# Patient Record
Sex: Female | Born: 1955
Health system: Southern US, Community
[De-identification: ages and names within clinical notes are randomized; demographics above are authoritative.]

## PROBLEM LIST (undated history)

## (undated) DIAGNOSIS — R011 Cardiac murmur, unspecified: Secondary | ICD-10-CM

## (undated) DIAGNOSIS — K2980 Duodenitis without bleeding: Secondary | ICD-10-CM

## (undated) DIAGNOSIS — K635 Polyp of colon: Secondary | ICD-10-CM

## (undated) DIAGNOSIS — J45909 Unspecified asthma, uncomplicated: Secondary | ICD-10-CM

## (undated) DIAGNOSIS — M5020 Other cervical disc displacement, unspecified cervical region: Secondary | ICD-10-CM

## (undated) DIAGNOSIS — Z87442 Personal history of urinary calculi: Secondary | ICD-10-CM

## (undated) DIAGNOSIS — K219 Gastro-esophageal reflux disease without esophagitis: Secondary | ICD-10-CM

## (undated) DIAGNOSIS — R7303 Prediabetes: Secondary | ICD-10-CM

## (undated) DIAGNOSIS — F419 Anxiety disorder, unspecified: Secondary | ICD-10-CM

## (undated) DIAGNOSIS — L309 Dermatitis, unspecified: Secondary | ICD-10-CM

## (undated) DIAGNOSIS — T7840XA Allergy, unspecified, initial encounter: Secondary | ICD-10-CM

## (undated) DIAGNOSIS — E7800 Pure hypercholesterolemia, unspecified: Secondary | ICD-10-CM

## (undated) HISTORY — PX: OTHER SURGICAL HISTORY: SHX169

## (undated) HISTORY — PX: CARDIAC CATHETERIZATION: SHX172

## (undated) HISTORY — DX: Duodenitis without bleeding: K29.80

## (undated) HISTORY — DX: Other cervical disc displacement, unspecified cervical region: M50.20

## (undated) HISTORY — DX: Gastro-esophageal reflux disease without esophagitis: K21.9

## (undated) HISTORY — DX: Cardiac murmur, unspecified: R01.1

## (undated) HISTORY — DX: Polyp of colon: K63.5

## (undated) HISTORY — DX: Allergy, unspecified, initial encounter: T78.40XA

## (undated) HISTORY — DX: Anxiety disorder, unspecified: F41.9

## (undated) HISTORY — DX: Dermatitis, unspecified: L30.9

## (undated) HISTORY — DX: Unspecified asthma, uncomplicated: J45.909

## (undated) HISTORY — DX: Pure hypercholesterolemia, unspecified: E78.00

---

## 1997-12-09 ENCOUNTER — Ambulatory Visit (HOSPITAL_COMMUNITY): Admission: RE | Admit: 1997-12-09 | Discharge: 1997-12-09 | Payer: Self-pay | Admitting: Internal Medicine

## 1998-10-21 ENCOUNTER — Other Ambulatory Visit: Admission: RE | Admit: 1998-10-21 | Discharge: 1998-10-21 | Payer: Self-pay | Admitting: Obstetrics & Gynecology

## 1999-04-17 ENCOUNTER — Emergency Department (HOSPITAL_COMMUNITY): Admission: EM | Admit: 1999-04-17 | Discharge: 1999-04-18 | Payer: Self-pay | Admitting: Emergency Medicine

## 1999-04-20 ENCOUNTER — Ambulatory Visit (HOSPITAL_COMMUNITY): Admission: RE | Admit: 1999-04-20 | Discharge: 1999-04-20 | Payer: Self-pay | Admitting: Internal Medicine

## 1999-04-21 ENCOUNTER — Ambulatory Visit (HOSPITAL_COMMUNITY): Admission: RE | Admit: 1999-04-21 | Discharge: 1999-04-21 | Payer: Self-pay | Admitting: Internal Medicine

## 1999-04-21 ENCOUNTER — Encounter: Payer: Self-pay | Admitting: Internal Medicine

## 1999-05-26 ENCOUNTER — Encounter: Payer: Self-pay | Admitting: Obstetrics & Gynecology

## 1999-05-26 ENCOUNTER — Ambulatory Visit (HOSPITAL_COMMUNITY): Admission: RE | Admit: 1999-05-26 | Discharge: 1999-05-26 | Payer: Self-pay | Admitting: Obstetrics & Gynecology

## 1999-08-17 ENCOUNTER — Encounter: Payer: Self-pay | Admitting: Emergency Medicine

## 1999-08-17 ENCOUNTER — Emergency Department (HOSPITAL_COMMUNITY): Admission: EM | Admit: 1999-08-17 | Discharge: 1999-08-17 | Payer: Self-pay | Admitting: Emergency Medicine

## 1999-08-20 ENCOUNTER — Ambulatory Visit (HOSPITAL_COMMUNITY): Admission: RE | Admit: 1999-08-20 | Discharge: 1999-08-20 | Payer: Self-pay | Admitting: Internal Medicine

## 1999-08-25 ENCOUNTER — Emergency Department (HOSPITAL_COMMUNITY): Admission: EM | Admit: 1999-08-25 | Discharge: 1999-08-25 | Payer: Self-pay | Admitting: Emergency Medicine

## 1999-09-02 ENCOUNTER — Ambulatory Visit (HOSPITAL_COMMUNITY): Admission: RE | Admit: 1999-09-02 | Discharge: 1999-09-02 | Payer: Self-pay | Admitting: Internal Medicine

## 1999-09-24 ENCOUNTER — Encounter: Admission: RE | Admit: 1999-09-24 | Discharge: 1999-09-24 | Payer: Self-pay | Admitting: Family Medicine

## 1999-12-14 ENCOUNTER — Other Ambulatory Visit: Admission: RE | Admit: 1999-12-14 | Discharge: 1999-12-14 | Payer: Self-pay | Admitting: Obstetrics & Gynecology

## 2000-05-29 ENCOUNTER — Encounter: Payer: Self-pay | Admitting: Obstetrics & Gynecology

## 2000-05-29 ENCOUNTER — Ambulatory Visit (HOSPITAL_COMMUNITY): Admission: RE | Admit: 2000-05-29 | Discharge: 2000-05-29 | Payer: Self-pay | Admitting: Obstetrics & Gynecology

## 2001-01-02 ENCOUNTER — Other Ambulatory Visit: Admission: RE | Admit: 2001-01-02 | Discharge: 2001-01-02 | Payer: Self-pay | Admitting: Obstetrics & Gynecology

## 2001-07-31 ENCOUNTER — Encounter: Payer: Self-pay | Admitting: Obstetrics & Gynecology

## 2001-07-31 ENCOUNTER — Ambulatory Visit (HOSPITAL_COMMUNITY): Admission: RE | Admit: 2001-07-31 | Discharge: 2001-07-31 | Payer: Self-pay | Admitting: Obstetrics & Gynecology

## 2001-11-27 ENCOUNTER — Other Ambulatory Visit: Admission: RE | Admit: 2001-11-27 | Discharge: 2001-11-27 | Payer: Self-pay | Admitting: Obstetrics & Gynecology

## 2002-06-28 ENCOUNTER — Emergency Department (HOSPITAL_COMMUNITY): Admission: EM | Admit: 2002-06-28 | Discharge: 2002-06-29 | Payer: Self-pay | Admitting: Emergency Medicine

## 2002-06-29 ENCOUNTER — Encounter: Payer: Self-pay | Admitting: *Deleted

## 2002-07-27 ENCOUNTER — Emergency Department (HOSPITAL_COMMUNITY): Admission: EM | Admit: 2002-07-27 | Discharge: 2002-07-28 | Payer: Self-pay | Admitting: Emergency Medicine

## 2002-08-01 ENCOUNTER — Ambulatory Visit (HOSPITAL_COMMUNITY): Admission: RE | Admit: 2002-08-01 | Discharge: 2002-08-01 | Payer: Self-pay | Admitting: Obstetrics & Gynecology

## 2002-08-01 ENCOUNTER — Encounter: Payer: Self-pay | Admitting: Obstetrics & Gynecology

## 2002-12-19 ENCOUNTER — Other Ambulatory Visit: Admission: RE | Admit: 2002-12-19 | Discharge: 2002-12-19 | Payer: Self-pay | Admitting: Obstetrics & Gynecology

## 2003-10-03 ENCOUNTER — Ambulatory Visit (HOSPITAL_COMMUNITY): Admission: RE | Admit: 2003-10-03 | Discharge: 2003-10-03 | Payer: Self-pay | Admitting: Obstetrics & Gynecology

## 2004-01-19 ENCOUNTER — Other Ambulatory Visit: Admission: RE | Admit: 2004-01-19 | Discharge: 2004-01-19 | Payer: Self-pay | Admitting: Obstetrics & Gynecology

## 2004-11-09 ENCOUNTER — Ambulatory Visit (HOSPITAL_COMMUNITY): Admission: RE | Admit: 2004-11-09 | Discharge: 2004-11-09 | Payer: Self-pay | Admitting: Obstetrics & Gynecology

## 2005-07-08 ENCOUNTER — Other Ambulatory Visit: Admission: RE | Admit: 2005-07-08 | Discharge: 2005-07-08 | Payer: Self-pay | Admitting: Obstetrics & Gynecology

## 2005-08-02 ENCOUNTER — Ambulatory Visit: Payer: Self-pay | Admitting: Family Medicine

## 2005-08-09 ENCOUNTER — Ambulatory Visit: Payer: Self-pay | Admitting: Family Medicine

## 2008-11-06 ENCOUNTER — Ambulatory Visit (HOSPITAL_COMMUNITY): Admission: RE | Admit: 2008-11-06 | Discharge: 2008-11-06 | Payer: Self-pay | Admitting: Obstetrics and Gynecology

## 2008-12-18 ENCOUNTER — Ambulatory Visit: Payer: Self-pay | Admitting: Licensed Clinical Social Worker

## 2009-11-20 ENCOUNTER — Ambulatory Visit (HOSPITAL_COMMUNITY): Admission: RE | Admit: 2009-11-20 | Discharge: 2009-11-20 | Payer: Self-pay | Admitting: Obstetrics and Gynecology

## 2009-11-26 ENCOUNTER — Encounter: Admission: RE | Admit: 2009-11-26 | Discharge: 2009-11-26 | Payer: Self-pay | Admitting: Obstetrics and Gynecology

## 2010-04-23 ENCOUNTER — Observation Stay (HOSPITAL_COMMUNITY): Admission: EM | Admit: 2010-04-23 | Discharge: 2010-04-23 | Payer: Self-pay | Admitting: Emergency Medicine

## 2010-04-27 ENCOUNTER — Telehealth (INDEPENDENT_AMBULATORY_CARE_PROVIDER_SITE_OTHER): Payer: Self-pay | Admitting: *Deleted

## 2010-04-27 ENCOUNTER — Encounter: Payer: Self-pay | Admitting: Cardiology

## 2010-04-28 ENCOUNTER — Ambulatory Visit: Payer: Self-pay | Admitting: Cardiology

## 2010-04-28 ENCOUNTER — Encounter: Payer: Self-pay | Admitting: Cardiology

## 2010-04-28 ENCOUNTER — Ambulatory Visit: Payer: Self-pay

## 2010-04-28 ENCOUNTER — Encounter (HOSPITAL_COMMUNITY): Admission: RE | Admit: 2010-04-28 | Discharge: 2010-06-10 | Payer: Self-pay | Admitting: Internal Medicine

## 2010-10-10 ENCOUNTER — Encounter: Payer: Self-pay | Admitting: Obstetrics and Gynecology

## 2010-10-19 NOTE — Letter (Signed)
Summary: [facility]   [facility]   Imported By: Marylou Mccoy 06/07/2010 14:20:30  _____________________________________________________________________  External Attachment:    Type:   Image     Comment:   External Document

## 2010-10-19 NOTE — Progress Notes (Signed)
Summary: Nuclear Pre-Procedure  Phone Note Outgoing Call Call back at [facility] Phone [phone]   Call placed by: Stanton Kidney, EMT-P,  April 27, 2010 2:37 PM Call placed to: Patient Action Taken: Phone Call Completed Summary of Call: Reviewed information on Myoview Information Sheet (see scanned document for further details).  Spoke with the patient.    Nuclear Med Background Indications for Stress Test: Evaluation for Ischemia, Post Hospital  Indications Comments: 04/23/10 [facility]: [place], MI R/O    Symptoms: Chest Pain    Nuclear Pre-Procedure Cardiac Risk Factors: Family History - CAD, Lipids

## 2010-10-19 NOTE — Assessment & Plan Note (Signed)
Summary: Cardiology Nuclear Testing  Nuclear Med Background Indications for Stress Test: Evaluation for Ischemia, Post Hospital  Indications Comments: 04/23/10 [facility]: [place], MI R/O  History: Asthma, COPD, GXT  History Comments: GXT 10 yrs ago: Ok per patient.  Symptoms: Chest Pain    Nuclear Pre-Procedure Cardiac Risk Factors: Family History - CAD, Lipids Caffeine/Decaff Intake: none NPO After: 6:00 PM Lungs: Clear IV 0.9% NS with Angio Cath: 22g     IV Site: Rt AC IV Started by: Bonnita Levan RN Chest Size (in) 38      Cup Size D     Height (in): 69 Weight (lb): 214 BMI: 31.72  Nuclear Med Study 1 or 2 day study:  1 day     Stress Test Type:  Stress Reading MD:  [name], MD     Referring MD:  Elmore Guise Resting Radionuclide:  Technetium 27m Tetrofosmin     Resting Radionuclide Dose:  11.0 mCi  Stress Radionuclide:  Technetium 47m Tetrofosmin     Stress Radionuclide Dose:  32.9 mCi   Stress Protocol Exercise Time (min):  9:00 min     Max HR:  171 bpm     Predicted Max HR:  166 bpm  Max Systolic BP: 182 mm Hg     Percent Max HR:  103.01 %     METS: 10.1 Rate Pressure Product:  [address]    Stress Test Technologist:  Irean Hong RN     Nuclear Technologist:  Harlow Asa CNMT  Rest Procedure  Myocardial perfusion imaging was performed at rest 45 minutes following the intravenous administration of Myoview Technetium 9m Tetrofosmin.  Stress Procedure  The patient exercised for  nine minutes.  The patient stopped due to DOE  and denied any chest pain.  The EKG was nondiagnostic due to baseline T-wave changes, rare PVC/Couplet.  Myoview was injected at peak exercise and myocardial perfusion imaging was performed after a brief delay.  QPS Raw Data Images:  Normal; no motion artifact; normal heart/lung ratio. Stress Images:  Small apical perfusion defect.  Rest Images:  Small apical perfusion defect.  Subtraction (SDS):  Small fixed apical perfusion defect.  Transient  Ischemic Dilatation:  0.89  (Normal <1.22)  Lung/Heart Ratio:  0.34  (Normal <0.45)  Quantitative Gated Spect Images QGS EDV:  97 ml QGS ESV:  29 ml QGS EF:  70 % QGS cine images:  Normal wall motion.    Overall Impression  Exercise Capacity: Good exercise capacity. BP Response: Normal blood pressure response. Clinical Symptoms: Stopped due to dyspnea, oxygen saturation dropped to 82% with exertion.  ECG Impression: Incomplete RBBB, no significant change with exertion.  Overall Impression: Small fixed apical perfusion defect appears to be most likely breast attenuation.  No evidence for ischemia or infarction.  Normal wall motion and EF.

## 2010-10-29 ENCOUNTER — Other Ambulatory Visit: Payer: Self-pay | Admitting: Internal Medicine

## 2010-10-29 DIAGNOSIS — Z1231 Encounter for screening mammogram for malignant neoplasm of breast: Secondary | ICD-10-CM

## 2010-11-25 ENCOUNTER — Ambulatory Visit
Admission: RE | Admit: 2010-11-25 | Discharge: 2010-11-25 | Disposition: A | Discharge status: Home / Self Care | Payer: BC Managed Care – PPO | Source: Ambulatory Visit | Attending: Internal Medicine | Admitting: Internal Medicine

## 2010-11-25 DIAGNOSIS — Z1231 Encounter for screening mammogram for malignant neoplasm of breast: Secondary | ICD-10-CM

## 2010-12-03 LAB — HEMOGLOBIN A1C
Hgb A1c MFr Bld: 6 % — ABNORMAL HIGH (ref <5.7)
Mean Plasma Glucose: 126 mg/dL — ABNORMAL HIGH (ref <117)

## 2010-12-03 LAB — CBC
HCT: 36.7 % (ref 36.0–46.0)
Hemoglobin: 12.1 g/dL (ref 12.0–15.0)
MCH: 29.8 pg (ref 26.0–34.0)
MCHC: 33 g/dL (ref 30.0–36.0)
MCV: 90.4 fL (ref 78.0–100.0)
Platelets: 220 10*3/uL (ref 150–400)
RBC: 4.06 MIL/uL (ref 3.87–5.11)
RDW: 12.3 % (ref 11.5–15.5)
WBC: 6.3 10*3/uL (ref 4.0–10.5)

## 2010-12-03 LAB — BASIC METABOLIC PANEL
BUN: 11 mg/dL (ref 6–23)
CO2: 24 mEq/L (ref 19–32)
Calcium: 8.9 mg/dL (ref 8.4–10.5)
Chloride: 104 mEq/L (ref 96–112)
Creatinine, Ser: 0.86 mg/dL (ref 0.4–1.2)
GFR calc Af Amer: >60 mL/min (ref >60)
GFR calc non Af Amer: >60 mL/min (ref >60)
Glucose, Bld: 113 mg/dL — ABNORMAL HIGH (ref 70–99)
Potassium: 3.7 mEq/L (ref 3.5–5.1)
Sodium: 137 mEq/L (ref 135–145)

## 2010-12-03 LAB — POCT CARDIAC MARKERS
CKMB, poc: 1.4 ng/mL (ref 1.0–8.0)
Myoglobin, poc: 83 ng/mL (ref 12–200)
Troponin i, poc: <0.05 ng/mL (ref 0.00–0.09)

## 2010-12-03 LAB — DIFFERENTIAL
Basophils Absolute: 0 10*3/uL (ref 0.0–0.1)
Basophils Relative: 1 % (ref 0–1)
Eosinophils Absolute: 0.4 10*3/uL (ref 0.0–0.7)
Eosinophils Relative: 6 % — ABNORMAL HIGH (ref 0–5)
Lymphocytes Relative: 38 % (ref 12–46)
Lymphs Abs: 2.4 10*3/uL (ref 0.7–4.0)
Monocytes Absolute: 0.7 10*3/uL (ref 0.1–1.0)
Monocytes Relative: 11 % (ref 3–12)
Neutro Abs: 2.8 10*3/uL (ref 1.7–7.7)
Neutrophils Relative %: 45 % (ref 43–77)

## 2010-12-03 LAB — TSH: TSH: 3.419 u[IU]/mL (ref 0.350–4.500)

## 2010-12-03 LAB — LIPID PANEL
Cholesterol: 150 mg/dL (ref 0–200)
HDL: 49 mg/dL (ref >39)
LDL Cholesterol: 81 mg/dL (ref 0–99)
Total CHOL/HDL Ratio: 3.1 RATIO
Triglycerides: 99 mg/dL (ref <150)
VLDL: 20 mg/dL (ref 0–40)

## 2010-12-03 LAB — CK TOTAL AND CKMB (NOT AT ARMC)
CK, MB: 2.5 ng/mL (ref 0.3–4.0)
Relative Index: 1.3 (ref 0.0–2.5)
Total CK: 190 U/L — ABNORMAL HIGH (ref 7–177)

## 2010-12-03 LAB — CARDIAC PANEL(CRET KIN+CKTOT+MB+TROPI)
CK, MB: 2 ng/mL (ref 0.3–4.0)
Relative Index: 1.2 (ref 0.0–2.5)
Total CK: 170 U/L (ref 7–177)
Troponin I: <0.01 ng/mL (ref 0.00–0.06)

## 2010-12-03 LAB — TROPONIN I: Troponin I: <0.01 ng/mL (ref 0.00–0.06)

## 2011-06-04 ENCOUNTER — Emergency Department (HOSPITAL_COMMUNITY): Payer: BC Managed Care – PPO

## 2011-06-04 ENCOUNTER — Emergency Department (HOSPITAL_COMMUNITY)
Admission: EM | Admit: 2011-06-04 | Discharge: 2011-06-04 | Disposition: A | Discharge status: Home / Self Care | Payer: BC Managed Care – PPO | Attending: Emergency Medicine | Admitting: Emergency Medicine

## 2011-06-04 DIAGNOSIS — R112 Nausea with vomiting, unspecified: Secondary | ICD-10-CM | POA: Insufficient documentation

## 2011-06-04 DIAGNOSIS — R109 Unspecified abdominal pain: Secondary | ICD-10-CM | POA: Insufficient documentation

## 2011-06-04 DIAGNOSIS — N200 Calculus of kidney: Secondary | ICD-10-CM | POA: Insufficient documentation

## 2011-06-04 DIAGNOSIS — N201 Calculus of ureter: Secondary | ICD-10-CM | POA: Insufficient documentation

## 2011-06-04 DIAGNOSIS — E7800 Pure hypercholesterolemia, unspecified: Secondary | ICD-10-CM | POA: Insufficient documentation

## 2011-06-04 LAB — URINALYSIS, ROUTINE W REFLEX MICROSCOPIC
Bilirubin Urine: NEGATIVE
Glucose, UA: NEGATIVE mg/dL
Ketones, ur: NEGATIVE mg/dL
Leukocytes, UA: NEGATIVE
Nitrite: NEGATIVE
Protein, ur: NEGATIVE mg/dL
Specific Gravity, Urine: 1.022 (ref 1.005–1.030)
Urobilinogen, UA: 0.2 mg/dL (ref 0.0–1.0)
pH: 5 (ref 5.0–8.0)

## 2011-06-04 LAB — URINE MICROSCOPIC-ADD ON

## 2011-11-02 ENCOUNTER — Other Ambulatory Visit (HOSPITAL_COMMUNITY): Payer: Self-pay | Admitting: Internal Medicine

## 2011-11-02 DIAGNOSIS — Z1231 Encounter for screening mammogram for malignant neoplasm of breast: Secondary | ICD-10-CM

## 2011-11-28 ENCOUNTER — Ambulatory Visit (HOSPITAL_COMMUNITY)
Admission: RE | Admit: 2011-11-28 | Discharge: 2011-11-28 | Disposition: A | Discharge status: Home / Self Care | Payer: BC Managed Care – PPO | Source: Ambulatory Visit | Attending: Internal Medicine | Admitting: Internal Medicine

## 2011-11-28 DIAGNOSIS — Z1231 Encounter for screening mammogram for malignant neoplasm of breast: Secondary | ICD-10-CM

## 2012-11-02 ENCOUNTER — Other Ambulatory Visit (HOSPITAL_COMMUNITY): Payer: Self-pay | Admitting: Internal Medicine

## 2012-11-02 DIAGNOSIS — Z1231 Encounter for screening mammogram for malignant neoplasm of breast: Secondary | ICD-10-CM

## 2012-11-28 ENCOUNTER — Ambulatory Visit (HOSPITAL_COMMUNITY)
Admission: RE | Admit: 2012-11-28 | Discharge: 2012-11-28 | Disposition: A | Discharge status: Home / Self Care | Payer: BC Managed Care – PPO | Source: Ambulatory Visit | Attending: Internal Medicine | Admitting: Internal Medicine

## 2012-11-28 DIAGNOSIS — Z1231 Encounter for screening mammogram for malignant neoplasm of breast: Secondary | ICD-10-CM

## 2013-11-06 ENCOUNTER — Other Ambulatory Visit (HOSPITAL_COMMUNITY): Payer: Self-pay | Admitting: Internal Medicine

## 2013-11-06 DIAGNOSIS — Z1231 Encounter for screening mammogram for malignant neoplasm of breast: Secondary | ICD-10-CM

## 2013-12-02 ENCOUNTER — Ambulatory Visit (HOSPITAL_COMMUNITY)
Admission: RE | Admit: 2013-12-02 | Discharge: 2013-12-02 | Disposition: A | Discharge status: Home / Self Care | Payer: Medicare HMO | Source: Ambulatory Visit | Attending: Internal Medicine | Admitting: Internal Medicine

## 2013-12-02 DIAGNOSIS — Z1231 Encounter for screening mammogram for malignant neoplasm of breast: Secondary | ICD-10-CM

## 2013-12-22 ENCOUNTER — Ambulatory Visit (INDEPENDENT_AMBULATORY_CARE_PROVIDER_SITE_OTHER): Payer: Medicare HMO | Admitting: Internal Medicine

## 2013-12-22 VITALS — BP 108/70 | HR 67 | Temp 97.9°F | Resp 18 | Ht 70.0 in | Wt 221.0 lb

## 2013-12-22 DIAGNOSIS — R591 Generalized enlarged lymph nodes: Secondary | ICD-10-CM

## 2013-12-22 DIAGNOSIS — R599 Enlarged lymph nodes, unspecified: Secondary | ICD-10-CM

## 2013-12-22 DIAGNOSIS — E785 Hyperlipidemia, unspecified: Secondary | ICD-10-CM | POA: Insufficient documentation

## 2013-12-22 DIAGNOSIS — J029 Acute pharyngitis, unspecified: Secondary | ICD-10-CM

## 2013-12-22 DIAGNOSIS — Z6831 Body mass index (BMI) 31.0-31.9, adult: Secondary | ICD-10-CM | POA: Insufficient documentation

## 2013-12-22 LAB — POCT RAPID STREP A (OFFICE): Rapid Strep A Screen: NEGATIVE

## 2013-12-22 MED ORDER — AMOXICILLIN 500 MG PO CAPS: 1000.0000 mg | ORAL_CAPSULE | Freq: Two times a day (BID) | ORAL | Status: AC

## 2013-12-22 NOTE — Progress Notes (Addendum)
   Subjective:    Patient ID: Abigail Byrd, female    DOB: 1955-10-23, 58 y.o.   MRN: [id number]  Sore Throat  This is a new problem. The current episode started yesterday. The problem has been unchanged. The pain is worse on the left side. Associated symptoms include trouble swallowing. Pertinent negatives include no coughing. She has had no exposure to strep.   Chief Complaint  Patient presents with  . Sore Throat    swelling on left side since last night   This chart was scribed for Ellamae Siaobert Nykerria Macconnell, MD by Andrew Auaven Small, ED Scribe. This patient was seen in room 9 and the patient's care was started at 3:32 PM.  HPI Comments: Abigail MarvelSusan S Kozel is a 58 y.o. female who presents to the [facility] complaining of left sided sore throat onset 1 day with associated trouble swallowing. Pt describes that pain as sharp. Pt denies fever, cough, chills, diaphoresis and rhinorrhea. Pt denies pain when biting down. Pt denies exposure to strep.    Past Medical History  Diagnosis Date  . Allergy   . Anxiety   . Asthma    No Known Allergies Prior to Admission medications   Medication Sig Start Date End Date Taking? Authorizing Provider  atorvastatin (LIPITOR) 10 MG tablet Take 10 mg by mouth daily.   Yes Historical Provider, MD   Review of Systems  Constitutional: Negative for fever, chills and diaphoresis.  HENT: Positive for sore throat and trouble swallowing. Negative for dental problem and rhinorrhea.   Respiratory: Negative for cough.       Objective:   Physical Exam  Nursing note and vitals reviewed. Constitutional: She is oriented to person, place, and time. She appears well-developed and well-nourished. No distress.  HENT:  Head: Normocephalic and atraumatic.  Right Ear: External ear normal.  Left Ear: External ear normal.  Mouth/Throat: Posterior oropharyngeal erythema present. No oropharyngeal exudate.  Parotid non tender, parotid duct clear, no swelling under  tongue, no dental abcess  Eyes: EOM are normal.  Neck: Neck supple.  2+ cervical lymph nodes bilaterally  Cardiovascular: Normal rate.   Pulmonary/Chest: Effort normal.  Musculoskeletal: Normal range of motion.  Neurological: She is alert and oriented to person, place, and time.  Skin: Skin is warm and dry.  Psychiatric: She has a normal mood and affect. Her behavior is normal.        Assessment & Plan:  I have completed the patient encounter in its entirety as documented by the scribe, with editing by me where necessary. Pasha Broad P. Merla Richesoolittle, M.D.  Dysphagia--throat pain Lymphadenopathy left anterior cervical  Meds ordered this encounter  Medications  . amoxicillin (AMOXIL) 500 MG capsule    Sig: Take 2 capsules (1,000 mg total) by mouth 2 (two) times daily.    Dispense:  40 capsule    Refill:  0   Needs close f/u w/ ref to ent for imaging if not impr

## 2013-12-24 LAB — CULTURE, GROUP A STREP: Organism ID, Bacteria: NORMAL

## 2014-12-10 ENCOUNTER — Other Ambulatory Visit (HOSPITAL_COMMUNITY): Payer: Self-pay | Admitting: Internal Medicine

## 2014-12-10 DIAGNOSIS — Z1231 Encounter for screening mammogram for malignant neoplasm of breast: Secondary | ICD-10-CM

## 2014-12-17 ENCOUNTER — Ambulatory Visit (HOSPITAL_COMMUNITY)
Admission: RE | Admit: 2014-12-17 | Discharge: 2014-12-17 | Disposition: A | Discharge status: Home / Self Care | Payer: Managed Care, Other (non HMO) | Source: Ambulatory Visit | Attending: Internal Medicine | Admitting: Internal Medicine

## 2014-12-17 DIAGNOSIS — Z1231 Encounter for screening mammogram for malignant neoplasm of breast: Secondary | ICD-10-CM | POA: Insufficient documentation

## 2015-01-14 ENCOUNTER — Other Ambulatory Visit: Payer: Self-pay | Admitting: Obstetrics & Gynecology

## 2015-01-14 DIAGNOSIS — M8580 Other specified disorders of bone density and structure, unspecified site: Secondary | ICD-10-CM

## 2015-03-18 ENCOUNTER — Ambulatory Visit
Admission: RE | Admit: 2015-03-18 | Discharge: 2015-03-18 | Disposition: A | Discharge status: Home / Self Care | Payer: Managed Care, Other (non HMO) | Source: Ambulatory Visit | Attending: Obstetrics & Gynecology | Admitting: Obstetrics & Gynecology

## 2015-03-18 DIAGNOSIS — M8580 Other specified disorders of bone density and structure, unspecified site: Secondary | ICD-10-CM

## 2015-05-05 ENCOUNTER — Ambulatory Visit (INDEPENDENT_AMBULATORY_CARE_PROVIDER_SITE_OTHER): Payer: Managed Care, Other (non HMO) | Admitting: Neurology

## 2015-05-05 ENCOUNTER — Encounter: Payer: Self-pay | Admitting: Neurology

## 2015-05-05 VITALS — BP 122/81 | HR 63 | Ht 69.0 in | Wt 223.6 lb

## 2015-05-05 DIAGNOSIS — R251 Tremor, unspecified: Secondary | ICD-10-CM | POA: Diagnosis not present

## 2015-05-05 NOTE — Progress Notes (Signed)
Reason for visit: Tremor  Referring physician: Dr. Salvatore Marvel Abigail Byrd is a 59 y.o. female  History of present illness:  Abigail Byrd is a 59 year old right-handed white female with a history of a tremor that has been present for about one year. The tremor is quite mild, not always noticeable. At times, she may have a head nodding tremor that is quite subtle usually while she is reading with her neck partially flexed. She has no other symptoms, no change in speech, swallowing, gait, or any evidence of weakness or numbness of the extremities. She denies any problems with balance or difficulty controlling the bowels or the bladder. She denies any family history of tremor, but her father died when he was 63, and she does not know many of the uncles and aunts on the father's side of the family. The patient herself does not notice the tremor, but her daughters have pointed this out to her. She comes in with a video clip of the tremor. She has no tremor affecting the arms or legs. She comes in for an evaluation.  Past Medical History  Diagnosis Date  . Allergy   . Anxiety   . Asthma   . Duodenitis   . HNP (herniated nucleus pulposus), cervical   . Hypercholesteremia   . Eczema   . Colon polyp   . Kidney stones     Past Surgical History  Procedure Laterality Date  . Cesarean section      Family History  Problem Relation Age of Onset  . Cancer Mother   . Heart disease Mother   . Cancer Father     Social history:  reports that she has never smoked. She has never used smokeless tobacco. She reports that she drinks about 3.0 - 3.6 oz of alcohol per week. She reports that she does not use illicit drugs.  Medications:  Prior to Admission medications   Medication Sig Start Date End Date Taking? Authorizing Provider  atorvastatin (LIPITOR) 10 MG tablet Take 10 mg by mouth daily.   Yes Historical Provider, MD      Allergies  Allergen Reactions  . Celebrex [Celecoxib]      ROS:  Out of a complete 14 system review of symptoms, the patient complains only of the following symptoms, and all other reviewed systems are negative.  Allergies Tremor  Blood pressure 122/81, pulse 63, height  (1.753 m), weight 223 lb 9.6 oz (101.424 kg).  Physical Exam  General: The patient is alert and cooperative at the time of the examination.  Eyes: Pupils are equal, round, and reactive to light. Discs are flat bilaterally.  Neck: The neck is supple, no carotid bruits are noted.  Respiratory: The respiratory examination is clear.  Cardiovascular: The cardiovascular examination reveals a regular rate and rhythm, no obvious murmurs or rubs are noted.  Skin: Extremities are without significant edema.  Neurologic Exam  Mental status: The patient is alert and oriented x 3 at the time of the examination. The patient has apparent normal recent and remote memory, with an apparently normal attention span and concentration ability.  Cranial nerves: Facial symmetry is present. There is good sensation of the face to pinprick and soft touch bilaterally. The strength of the facial muscles and the muscles to head turning and shoulder shrug are normal bilaterally. Speech is well enunciated, no aphasia or dysarthria is noted. Extraocular movements are full. Visual fields are full. The tongue is midline, and the patient has symmetric  elevation of the soft palate. No obvious hearing deficits are noted.  Motor: The motor testing reveals 5 over 5 strength of all 4 extremities. Good symmetric motor tone is noted throughout.  Sensory: Sensory testing is intact to pinprick, soft touch, vibration sensation, and position sense on all 4 extremities. No evidence of extinction is noted.  Coordination: Cerebellar testing reveals good finger-nose-finger and heel-to-shin bilaterally.  Gait and station: Gait is normal. Tandem gait is normal. Romberg is negative. No drift is seen.  Reflexes:  Deep tendon reflexes are symmetric and normal bilaterally. Toes are downgoing bilaterally.   Assessment/Plan:  1. Head tremor, likely essential tremor  The patient currently does not have a tremor noticeable on clinical examination today. The video clip that she brought with her does show evidence of a very subtle head nodding tremor that likely represents an essential tremor. The patient has no definite family history of tremor, but her father died at young age, and may not have manifested the tremor. The head tremors with the essential type tremors may be a head nod or a side-to-side tremor. There are no other features of parkinsonism. At this point, I would not treat the tremor, the patient will contact me if the tremor is significantly worsening, or other new symptoms are noted. The patient otherwise will follow-up on an as-needed basis.   Marlan Palau MD 05/05/2015 7:59 PM  [facility] [address] [place], [place] [address]  Phone [phone] Fax [phone]

## 2015-05-05 NOTE — Patient Instructions (Signed)

## 2015-11-13 ENCOUNTER — Other Ambulatory Visit: Payer: Self-pay

## 2015-11-13 DIAGNOSIS — Z1231 Encounter for screening mammogram for malignant neoplasm of breast: Secondary | ICD-10-CM

## 2015-12-18 ENCOUNTER — Ambulatory Visit
Admission: RE | Admit: 2015-12-18 | Discharge: 2015-12-18 | Disposition: A | Discharge status: Home / Self Care | Payer: Managed Care, Other (non HMO) | Source: Ambulatory Visit

## 2015-12-18 DIAGNOSIS — Z1231 Encounter for screening mammogram for malignant neoplasm of breast: Secondary | ICD-10-CM

## 2016-08-02 ENCOUNTER — Ambulatory Visit
Admission: RE | Admit: 2016-08-02 | Discharge: 2016-08-02 | Disposition: A | Discharge status: Home / Self Care | Payer: Managed Care, Other (non HMO) | Source: Ambulatory Visit | Attending: Internal Medicine | Admitting: Internal Medicine

## 2016-08-02 ENCOUNTER — Other Ambulatory Visit: Payer: Self-pay | Admitting: Internal Medicine

## 2016-08-02 DIAGNOSIS — R52 Pain, unspecified: Secondary | ICD-10-CM

## 2016-10-13 DIAGNOSIS — R1110 Vomiting, unspecified: Secondary | ICD-10-CM | POA: Diagnosis not present

## 2016-10-14 ENCOUNTER — Emergency Department (HOSPITAL_COMMUNITY)
Admission: EM | Admit: 2016-10-14 | Discharge: 2016-10-14 | Disposition: A | Discharge status: Home / Self Care | Payer: Managed Care, Other (non HMO) | Attending: Emergency Medicine | Admitting: Emergency Medicine

## 2016-10-14 ENCOUNTER — Encounter (HOSPITAL_COMMUNITY): Payer: Self-pay | Admitting: Emergency Medicine

## 2016-10-14 DIAGNOSIS — R1312 Dysphagia, oropharyngeal phase: Secondary | ICD-10-CM | POA: Diagnosis not present

## 2016-10-14 DIAGNOSIS — J45909 Unspecified asthma, uncomplicated: Secondary | ICD-10-CM | POA: Insufficient documentation

## 2016-10-14 LAB — CBC
HCT: 41.2 % (ref 36.0–46.0)
Hemoglobin: 14 g/dL (ref 12.0–15.0)
MCH: 30.9 pg (ref 26.0–34.0)
MCHC: 34 g/dL (ref 30.0–36.0)
MCV: 90.9 fL (ref 78.0–100.0)
Platelets: 288 10*3/uL (ref 150–400)
RBC: 4.53 MIL/uL (ref 3.87–5.11)
RDW: 12.2 % (ref 11.5–15.5)
WBC: 6.9 10*3/uL (ref 4.0–10.5)

## 2016-10-14 LAB — COMPREHENSIVE METABOLIC PANEL
ALT: 21 U/L (ref 14–54)
AST: 26 U/L (ref 15–41)
Albumin: 4.9 g/dL (ref 3.5–5.0)
Alkaline Phosphatase: 76 U/L (ref 38–126)
Anion gap: 8 (ref 5–15)
BUN: 13 mg/dL (ref 6–20)
CO2: 29 mmol/L (ref 22–32)
Calcium: 9.8 mg/dL (ref 8.9–10.3)
Chloride: 106 mmol/L (ref 101–111)
Creatinine, Ser: 0.72 mg/dL (ref 0.44–1.00)
GFR calc Af Amer: >60 mL/min (ref >60)
GFR calc non Af Amer: >60 mL/min (ref >60)
Glucose, Bld: 102 mg/dL — ABNORMAL HIGH (ref 65–99)
Potassium: 3.9 mmol/L (ref 3.5–5.1)
Sodium: 143 mmol/L (ref 135–145)
Total Bilirubin: 0.9 mg/dL (ref 0.3–1.2)
Total Protein: 8.5 g/dL — ABNORMAL HIGH (ref 6.5–8.1)

## 2016-10-14 LAB — LIPASE, BLOOD: Lipase: 32 U/L (ref 11–51)

## 2016-10-14 MED ORDER — ONDANSETRON 4 MG PO TBDP
4.0000 mg | ORAL_TABLET | Freq: Once | ORAL | Status: AC | PRN
  Administered 2016-10-14: 4 mg via ORAL
  Filled 2016-10-14: qty 1

## 2016-10-14 NOTE — Discharge Instructions (Signed)
Keep your scheduled appointment with Dr. Laural BenesJohnson for February 28. Tell him about today's and previous episodes of difficulty swallowing. Return if concern for any reason

## 2016-10-14 NOTE — ED Notes (Signed)
Pt states she is able to now drink liquids and is feeling better.  Change in placement for MD evaluation.

## 2016-10-14 NOTE — ED Triage Notes (Signed)
Patient states that since 1 pm yesterday started having n/v. Patient called MD and was told to take Pepcid and prilosec. Patient MD out of town and states since she hasnt been able to keep anything down since yesterday to come on in to ED.  Patient ate steak with horseradish so initially thought was acid reflux pat state that felt little better with taking medications.

## 2016-10-14 NOTE — ED Provider Notes (Signed)
[facility]-EMERGENCY DEPT Provider Note   CSN: [id number] Arrival date & time: 10/14/16  1107  By signing my name below, I, Soijett Blue, attest that this documentation has been prepared under the direction and in the presence of Doug Sou, MD. Electronically Signed: Soijett Blue, ED Scribe. 10/14/16. 1:35 PM.  History   Chief Complaint Chief Complaint  Patient presents with  . Emesis    HPI Abigail Byrd is a 61 y.o. female with a PMHx of acid reflux, high cholesterol, who presents to the Emergency Department complaining of mild vomiting onset yesterday. Pt is having resolved associated symptoms of sensation of lump to throat, burning sensation to throat. She has tried prilosec, pepcid, and ginger ale, with relief of her symptoms. Pt notes that she ate a steak with horseradish prior to the onset of her symptoms. Pt has had two similar symptoms in the past year and has been evaluated by a gastroenterologist.she feels a piece of steak may have been stuck in her esophagus until earlier today. She points to the proximal one third of her esophagus. Symptoms resolved after she vomited approximately 30 mins prior to coming here and small pieces of steak came out. She has since drunk water with no nausea or vomiting and she is completely asymptomatic without treatment Pt states that she is currently at her baseline due to spitting up a small piece of steak. She denies fever, chills, and any other symptoms. Pt notes that she doesn't smoke cigarettes and denies illegal drug use. Pt endorses ETOH use x 5 drinks weekly.    The history is provided by the patient. No language interpreter was used.    Past Medical History:  Diagnosis Date  . Allergy   . Anxiety   . Asthma   . Colon polyp   . Duodenitis   . Eczema   . HNP (herniated nucleus pulposus), cervical   . Hypercholesteremia   . Kidney stones     Patient Active Problem List   Diagnosis Date Noted  . Tremor 05/05/2015  . Other and  unspecified hyperlipidemia 12/22/2013  . BMI 31.0-31.9,adult 12/22/2013    Past Surgical History:  Procedure Laterality Date  . CESAREAN SECTION      OB History    No data available       Home Medications    Prior to Admission medications   Medication Sig Start Date End Date Taking? Authorizing Provider  atorvastatin (LIPITOR) 10 MG tablet Take 10 mg by mouth daily.    Historical Provider, MD    Family History Family History  Problem Relation Age of Onset  . Cancer Mother   . Heart disease Mother   . Cancer Father     Social History Social History  Substance Use Topics  . Smoking status: Never Smoker  . Smokeless tobacco: Never Used  . Alcohol use 3.0 - 3.6 oz/week    5 - 6 Standard drinks or equivalent per week     Allergies   Celebrex [celecoxib]   Review of Systems Review of Systems  Constitutional: Negative for appetite change (resolved).  HENT:       +burning and sensation of lump to throathere dysphagia  Respiratory: Negative.   Cardiovascular: Negative.   Gastrointestinal: Positive for vomiting (resolved).  Musculoskeletal: Negative.   Skin: Negative.   Neurological: Negative.   Psychiatric/Behavioral: Negative.   All other systems reviewed and are negative.    Physical Exam Updated Vital Signs BP 128/89   Pulse 80   Resp  16   SpO2 94%   Physical Exam  Constitutional: She appears well-developed and well-nourished.  HENT:  Head: Normocephalic and atraumatic.  Eyes: Conjunctivae are normal. Pupils are equal, round, and reactive to light.  Neck: Neck supple. No tracheal deviation present. No thyromegaly present.  Cardiovascular: Normal rate and regular rhythm.   No murmur heard. Pulmonary/Chest: Effort normal and breath sounds normal.  Abdominal: Soft. Bowel sounds are normal. She exhibits no distension. There is no tenderness.  Musculoskeletal: Normal range of motion. She exhibits no edema or tenderness.  Neurological: She is alert.  Coordination normal.  Skin: Skin is warm and dry. No rash noted.  Psychiatric: She has a normal mood and affect.  Nursing note and vitals reviewed.    ED Treatments / Results  DIAGNOSTIC STUDIES: Oxygen Saturation is 94% on RA, adequate by my interpretation.    COORDINATION OF CARE: 1:34 PM Discussed treatment plan with pt at bedside which includes labs, UA, follow up with gastroenterologist, and pt agreed to plan.   Labs (all labs ordered are listed, but only abnormal results are displayed) Labs Reviewed  COMPREHENSIVE METABOLIC PANEL - Abnormal; Notable for the following:       Result Value   Glucose, Bld 102 (*)    Total Protein 8.5 (*)    All other components within normal limits  LIPASE, BLOOD  CBC  URINALYSIS, ROUTINE W REFLEX MICROSCOPIC   Results for orders placed or performed during the hospital encounter of 10/14/16  Lipase, blood  Result Value Ref Range   Lipase 32 11 - 51 U/L  Comprehensive metabolic panel  Result Value Ref Range   Sodium 143 135 - 145 mmol/L   Potassium 3.9 3.5 - 5.1 mmol/L   Chloride 106 101 - 111 mmol/L   CO2 29 22 - 32 mmol/L   Glucose, Bld 102 (H) 65 - 99 mg/dL   BUN 13 6 - 20 mg/dL   Creatinine, Ser 1.61 0.44 - 1.00 mg/dL   Calcium 9.8 8.9 - 09.6 mg/dL   Total Protein 8.5 (H) 6.5 - 8.1 g/dL   Albumin 4.9 3.5 - 5.0 g/dL   AST 26 15 - 41 U/L   ALT 21 14 - 54 U/L   Alkaline Phosphatase 76 38 - 126 U/L   Total Bilirubin 0.9 0.3 - 1.2 mg/dL   GFR calc non Af Amer >60 >60 mL/min   GFR calc Af Amer >60 >60 mL/min   Anion gap 8 5 - 15  CBC  Result Value Ref Range   WBC 6.9 4.0 - 10.5 K/uL   RBC 4.53 3.87 - 5.11 MIL/uL   Hemoglobin 14.0 12.0 - 15.0 g/dL   HCT 04.5 40.9 - 81.1 %   MCV 90.9 78.0 - 100.0 fL   MCH 30.9 26.0 - 34.0 pg   MCHC 34.0 30.0 - 36.0 g/dL   RDW 91.4 78.2 - 95.6 %   Platelets 288 150 - 400 K/uL   No results found. Procedures Procedures (including critical care time)  Medications Ordered in ED Medications    ondansetron (ZOFRAN-ODT) disintegrating tablet 4 mg (4 mg Oral Given 10/14/16 1133)     Initial Impression / Assessment and Plan / ED Course  I have reviewed the triage vital signs and the nursing notes.  Pertinent labs that were available during my care of the patient were reviewed by me and considered in my medical decision making (see chart for details).     I suspect patient had esophageal food impaction  as she stated she would drink water up until earlier today and water would not go down without immediate regurgitation. She felt immediate relief after she last vomited with pieces of steak in emesis. Patient has experienced this a few times over recent months. I encouraged her to cut her food into small pieces and to keep her scheduled appointment with her gastroenterologist Dr. Laural BenesJohnson next month  Final Clinical Impressions(s) / ED Diagnoses  Diagnosis dysphagia Final diagnoses:  None    New Prescriptions New Prescriptions   No medications on file   I personally performed the services described in this documentation, which was scribed in my presence. The recorded information has been reviewed and considered.     Doug SouSam Adnan Vanvoorhis, MD 10/14/16 1345

## 2016-10-18 DIAGNOSIS — B029 Zoster without complications: Secondary | ICD-10-CM | POA: Diagnosis not present

## 2016-11-18 ENCOUNTER — Other Ambulatory Visit: Payer: Self-pay | Admitting: Gastroenterology

## 2016-11-21 ENCOUNTER — Encounter (HOSPITAL_COMMUNITY): Payer: Self-pay | Admitting: *Deleted

## 2016-11-22 ENCOUNTER — Ambulatory Visit (HOSPITAL_COMMUNITY)
Admission: RE | Admit: 2016-11-22 | Discharge: 2016-11-22 | Disposition: A | Discharge status: Home / Self Care | Payer: BLUE CROSS/BLUE SHIELD | Source: Ambulatory Visit | Attending: Gastroenterology | Admitting: Gastroenterology

## 2016-11-22 ENCOUNTER — Ambulatory Visit (HOSPITAL_COMMUNITY): Payer: BLUE CROSS/BLUE SHIELD | Admitting: Certified Registered"

## 2016-11-22 ENCOUNTER — Encounter (HOSPITAL_COMMUNITY)
Admission: RE | Disposition: A | Discharge status: Home / Self Care | Payer: Self-pay | Source: Ambulatory Visit | Attending: Gastroenterology

## 2016-11-22 ENCOUNTER — Encounter (HOSPITAL_COMMUNITY): Payer: Self-pay | Admitting: *Deleted

## 2016-11-22 DIAGNOSIS — Z1211 Encounter for screening for malignant neoplasm of colon: Secondary | ICD-10-CM | POA: Diagnosis not present

## 2016-11-22 DIAGNOSIS — Z86010 Personal history of colonic polyps: Secondary | ICD-10-CM | POA: Diagnosis not present

## 2016-11-22 DIAGNOSIS — M5020 Other cervical disc displacement, unspecified cervical region: Secondary | ICD-10-CM | POA: Insufficient documentation

## 2016-11-22 DIAGNOSIS — L309 Dermatitis, unspecified: Secondary | ICD-10-CM | POA: Diagnosis not present

## 2016-11-22 DIAGNOSIS — K200 Eosinophilic esophagitis: Secondary | ICD-10-CM | POA: Diagnosis not present

## 2016-11-22 DIAGNOSIS — E669 Obesity, unspecified: Secondary | ICD-10-CM | POA: Insufficient documentation

## 2016-11-22 DIAGNOSIS — E7800 Pure hypercholesterolemia, unspecified: Secondary | ICD-10-CM | POA: Insufficient documentation

## 2016-11-22 DIAGNOSIS — R1314 Dysphagia, pharyngoesophageal phase: Secondary | ICD-10-CM | POA: Diagnosis not present

## 2016-11-22 HISTORY — PX: COLONOSCOPY WITH PROPOFOL: SHX5780

## 2016-11-22 HISTORY — PX: SAVORY DILATION: SHX5439

## 2016-11-22 HISTORY — PX: BALLOON DILATION: SHX5330

## 2016-11-22 HISTORY — PX: ESOPHAGOGASTRODUODENOSCOPY (EGD) WITH PROPOFOL: SHX5813

## 2016-11-22 HISTORY — DX: Personal history of urinary calculi: Z87.442

## 2016-11-22 SURGERY — COLONOSCOPY WITH PROPOFOL
Anesthesia: Monitor Anesthesia Care

## 2016-11-22 MED ORDER — PROPOFOL 10 MG/ML IV BOLUS
INTRAVENOUS | Status: AC
  Filled 2016-11-22: qty 40

## 2016-11-22 MED ORDER — PROPOFOL 10 MG/ML IV BOLUS
INTRAVENOUS | Status: AC
  Filled 2016-11-22: qty 20

## 2016-11-22 MED ORDER — LACTATED RINGERS IV SOLN
INTRAVENOUS | Status: DC
  Administered 2016-11-22: 1000 mL via INTRAVENOUS

## 2016-11-22 MED ORDER — EPHEDRINE 5 MG/ML INJ
INTRAVENOUS | Status: AC
  Filled 2016-11-22: qty 10

## 2016-11-22 MED ORDER — ONDANSETRON HCL 4 MG/2ML IJ SOLN
INTRAMUSCULAR | Status: AC
  Filled 2016-11-22: qty 2

## 2016-11-22 MED ORDER — PROPOFOL 10 MG/ML IV BOLUS
INTRAVENOUS | Status: DC | PRN
  Administered 2016-11-22 (×2): 40 mg via INTRAVENOUS
  Administered 2016-11-22: 10 mg via INTRAVENOUS
  Administered 2016-11-22 (×3): 40 mg via INTRAVENOUS
  Administered 2016-11-22: 60 mg via INTRAVENOUS
  Administered 2016-11-22 (×4): 40 mg via INTRAVENOUS

## 2016-11-22 MED ORDER — EPHEDRINE SULFATE-NACL 50-0.9 MG/10ML-% IV SOSY
PREFILLED_SYRINGE | INTRAVENOUS | Status: DC | PRN
  Administered 2016-11-22: 10 mg via INTRAVENOUS

## 2016-11-22 MED ORDER — LIDOCAINE 2% (20 MG/ML) 5 ML SYRINGE
INTRAMUSCULAR | Status: DC | PRN
  Administered 2016-11-22: 60 mg via INTRAVENOUS

## 2016-11-22 MED ORDER — ONDANSETRON HCL 4 MG/2ML IJ SOLN
INTRAMUSCULAR | Status: DC | PRN
  Administered 2016-11-22: 4 mg via INTRAVENOUS

## 2016-11-22 SURGICAL SUPPLY — 24 items

## 2016-11-22 NOTE — Transfer of Care (Signed)
Immediate Anesthesia Transfer of Care Note  Patient: AILYNE PAWLEY  Procedure(s) Performed: Procedure(s): COLONOSCOPY WITH PROPOFOL (N/A) ESOPHAGOGASTRODUODENOSCOPY (EGD) WITH PROPOFOL (N/A) BALLOON DILATION (N/A) SAVORY DILATION (N/A)  Patient Location: PACU and Endoscopy Unit  Anesthesia Type:MAC  Level of Consciousness: sedated  Airway & Oxygen Therapy: Patient Spontanous Breathing and Patient connected to nasal cannula oxygen  Post-op Assessment: Report given to RN and Post -op Vital signs reviewed and stable  Post vital signs: Reviewed and stable  Last Vitals:  Vitals:   11/22/16 1135  BP: (!) 141/84  Resp: (!) 9  Temp: 36.8 C    Last Pain:  Vitals:   11/22/16 1135  TempSrc: Oral         Complications: No apparent anesthesia complications

## 2016-11-22 NOTE — Anesthesia Preprocedure Evaluation (Addendum)
Anesthesia Evaluation  Patient identified by MRN, date of birth, ID band Patient awake    Reviewed: Allergy & Precautions, NPO status , Patient's Chart, lab work & pertinent test results  Airway Mallampati: II  TM Distance: >3 FB Neck ROM: Full    Dental no notable dental hx. (+) Teeth Intact, Caps, Dental Advisory Given   Pulmonary asthma ,    Pulmonary exam normal breath sounds clear to auscultation       Cardiovascular negative cardio ROS Normal cardiovascular exam Rhythm:Regular Rate:Normal     Neuro/Psych Anxiety negative neurological ROS     GI/Hepatic Neg liver ROS, Hx/o colon polyps Dysphagia   Endo/Other  Hypercholesterolemia Obesity  Renal/GU Hx/o renal calculi  negative genitourinary   Musculoskeletal Hx/o HNP cervical spine Eczema   Abdominal (+) + obese,   Peds  Hematology negative hematology ROS (+)   Anesthesia Other Findings   Reproductive/Obstetrics                           Anesthesia Physical Anesthesia Plan  ASA: II  Anesthesia Plan: MAC   Post-op Pain Management:    Induction: Intravenous  Airway Management Planned: Natural Airway and Nasal Cannula  Additional Equipment:   Intra-op Plan:   Post-operative Plan:   Informed Consent: I have reviewed the patients History and Physical, chart, labs and discussed the procedure including the risks, benefits and alternatives for the proposed anesthesia with the patient or authorized representative who has indicated his/her understanding and acceptance.     Plan Discussed with: CRNA, Anesthesiologist and Surgeon  Anesthesia Plan Comments:         Anesthesia Quick Evaluation

## 2016-11-22 NOTE — Op Note (Signed)
[facility] Patient Name: Abigail Byrd Procedure Date: 11/22/2016 MRN: [id number] Attending MD: Charolett Bumpers , MD Date of Birth: [birth year] CSN: [id number] Age: 61 Admit Type: Outpatient Procedure:                Colonoscopy Indications:              High risk colon cancer surveillance: Personal                            history of non-advanced adenoma Providers:                Charolett Bumpers, MD, Anthony Sar, RN, Lorenda Ishihara, Technician, Heron Nay, CRNA Referring MD:              Medicines:                Propofol per Anesthesia Complications:            No immediate complications. Estimated Blood Loss:     Estimated blood loss: none. Procedure:                Pre-Anesthesia Assessment:                           - Prior to the procedure, a History and Physical                            was performed, and patient medications and                            allergies were reviewed. The patient's tolerance of                            previous anesthesia was also reviewed. The risks                            and benefits of the procedure and the sedation                            options and risks were discussed with the patient.                            All questions were answered, and informed consent                            was obtained. Prior Anticoagulants: The patient has                            taken no previous anticoagulant or antiplatelet                            agents. ASA Grade Assessment: II - A patient with  mild systemic disease. After reviewing the risks                            and benefits, the patient was deemed in                            satisfactory condition to undergo the procedure.                           After obtaining informed consent, the colonoscope                            was passed under direct vision. Throughout the                             procedure, the patient's blood pressure, pulse, and                            oxygen saturations were monitored continuously. The                            [id number] ([id number]) scope was introduced through                            the anus and advanced to the the cecum, identified                            by appendiceal orifice and ileocecal valve. The                            colonoscopy was performed without difficulty. The                            patient tolerated the procedure well. The quality                            of the bowel preparation was good. The appendiceal                            orifice and the rectum were photographed. Scope In: 12:56:10 PM Scope Out: 1:17:56 PM Scope Withdrawal Time: 0 hours 8 minutes 24 seconds  Total Procedure Duration: 0 hours 21 minutes 46 seconds  Findings:      The perianal and digital rectal examinations were normal.      The entire examined colon appeared normal. Impression:               - The entire examined colon is normal.                           - No specimens collected. Moderate Sedation:      N/A- Per Anesthesia Care Recommendation:           - Patient has a contact number available for  emergencies. The signs and symptoms of potential                            delayed complications were discussed with the                            patient. Return to normal activities tomorrow.                            Written discharge instructions were provided to the                            patient.                           - Repeat colonoscopy in 5 years for surveillance.                           - Resume previous diet.                           - Continue present medications. Procedure Code(s):        --- Professional ---                           [id number], Colorectal cancer screening; colonoscopy on                            individual at high risk Diagnosis Code(s):        --- Professional  ---                           Z86.010, Personal history of colonic polyps CPT copyright 2016 American Medical Association. All rights reserved. The codes documented in this report are preliminary and upon coder review may  be revised to meet current compliance requirements. Danise EdgeMartin Ervan Heber, MD Charolett BumpersMartin K Lucilia Yanni, MD 11/22/2016 1:23:43 PM This report has been signed electronically. Number of Addenda: 0

## 2016-11-22 NOTE — Anesthesia Postprocedure Evaluation (Signed)
Anesthesia Post Note  Patient: Abigail Byrd  Procedure(s) Performed: Procedure(s) (LRB): COLONOSCOPY WITH PROPOFOL (N/A) ESOPHAGOGASTRODUODENOSCOPY (EGD) WITH PROPOFOL (N/A) BALLOON DILATION (N/A) SAVORY DILATION (N/A)  Patient location during evaluation: PACU Anesthesia Type: MAC Level of consciousness: awake and alert and oriented Pain management: pain level controlled Vital Signs Assessment: post-procedure vital signs reviewed and stable Respiratory status: spontaneous breathing, nonlabored ventilation and respiratory function stable Cardiovascular status: stable and blood pressure returned to baseline Postop Assessment: no signs of nausea or vomiting Anesthetic complications: no       Last Vitals:  Vitals:   11/22/16 1321 11/22/16 1330  BP: 110/60 110/69  Pulse: 74   Resp: 15   Temp: 36.6 C     Last Pain:  Vitals:   11/22/16 1321  TempSrc: Oral                 Kagan Hietpas A.

## 2016-11-22 NOTE — H&P (Signed)
  Procedures: Esophagogastroduodenoscopy with possible esophageal stricture dilation followed by surveillance colonoscopy. 01/18/2012 surveillance colonoscopy was performed with removal of a 4 mm sessile tubular adenomatous sigmoid colon polyp. Solid food esophageal dysphagia.  History: The patient is a 61 year old female born February 22, 1956. Over the past 6 months she has had 3 episodes of solid food esophageal dysphagia unassociated with weight loss. She denies chronic heartburn. She has no difficulty swallowing liquids. She has undergone colonoscopic exams in the past to remove adenomatous colon polyps.  She is scheduled to undergo diagnostic esophagogastroduodenoscopy with possible esophageal stricture dilation followed by surveillance colonoscopy.  Past medical history: Hypercholesterolemia. Cesarean sections.  Exam: The patient is alert and lying comfortably on the endoscopy stretcher. Abdomen is soft and nontender to palpation. Lungs are clear to auscultation. Cardiac exam reveals a regular rhythm.  Plan: Proceed with esophagogastroduodenoscopy with possible esophageal dilation followed by surveillance colonoscopy

## 2016-11-22 NOTE — Op Note (Signed)
[facility] Patient Name: Abigail AblerSusan Byrd Procedure Date: 11/22/2016 MRN: [id number] Attending MD: Charolett BumpersMartin K [name] , MD Date of Birth: [birth year] CSN: [id number] Age: 61 Admit Type: Outpatient Procedure:                Upper GI endoscopy Indications:              Dysphagia Providers:                Charolett BumpersMartin K. Abigail Govan, MD, Anthony Saraniel Madden, RN, Lorenda IshiharaSam                            Tetteh, Technician, Heron NayAngie Mirarchi, CRNA Referring MD:              Medicines:                Propofol per Anesthesia Complications:            No immediate complications. Estimated Blood Loss:     Estimated blood loss was minimal. Procedure:                Pre-Anesthesia Assessment:                           - Prior to the procedure, a History and Physical                            was performed, and patient medications and                            allergies were reviewed. The patient's tolerance of                            previous anesthesia was also reviewed. The risks                            and benefits of the procedure and the sedation                            options and risks were discussed with the patient.                            All questions were answered, and informed consent                            was obtained. Prior Anticoagulants: The patient has                            taken no previous anticoagulant or antiplatelet                            agents. ASA Grade Assessment: II - A patient with                            mild systemic disease. After reviewing the risks  and benefits, the patient was deemed in                            satisfactory condition to undergo the procedure.                           After obtaining informed consent, the endoscope was                            passed under direct vision. Throughout the                            procedure, the patient's blood pressure, pulse, and                            oxygen  saturations were monitored continuously. The                            [id number]) scope was introduced through the                            mouth, and advanced to the second part of duodenum.                            The upper GI endoscopy was accomplished without                            difficulty. The patient tolerated the procedure                            well. Scope In: Scope Out: Findings:      The Z-line was variable and was found 40 cm from the incisors.      The examined esophagus was normal. Biopsies were taken with a cold       forceps for histology to look for eosinophilic esophagitis..      The entire examined stomach was normal.      The examined duodenum was normal. Impression:               - Z-line variable, 40 cm from the incisors.                           - Normal esophagus. Biopsied R/O EoE.                           - Normal stomach.                           - Normal examined duodenum. Moderate Sedation:      N/A- Per Anesthesia Care Recommendation:           - Patient has a contact number available for                            emergencies. The signs and symptoms of potential  delayed complications were discussed with the                            patient. Return to normal activities tomorrow.                            Written discharge instructions were provided to the                            patient.                           - Await pathology results.                           - Resume previous diet.                           - Continue present medications. Procedure Code(s):        --- Professional ---                           [phone], Esophagogastroduodenoscopy, flexible,                            transoral; with biopsy, single or multiple Diagnosis Code(s):        --- Professional ---                           R13.10, Dysphagia, unspecified CPT copyright 2016 American Medical Association. All rights  reserved. The codes documented in this report are preliminary and upon coder review may  be revised to meet current compliance requirements. Danise Edge, MD Charolett Bumpers, MD 11/22/2016 1:27:52 PM This report has been signed electronically. Number of Addenda: 0

## 2016-11-22 NOTE — Discharge Instructions (Signed)

## 2016-11-23 ENCOUNTER — Encounter (HOSPITAL_COMMUNITY): Payer: Self-pay | Admitting: Gastroenterology

## 2016-12-02 ENCOUNTER — Other Ambulatory Visit: Payer: Self-pay | Admitting: Internal Medicine

## 2016-12-02 DIAGNOSIS — Z1231 Encounter for screening mammogram for malignant neoplasm of breast: Secondary | ICD-10-CM

## 2016-12-21 ENCOUNTER — Ambulatory Visit
Admission: RE | Admit: 2016-12-21 | Discharge: 2016-12-21 | Disposition: A | Discharge status: Home / Self Care | Payer: BLUE CROSS/BLUE SHIELD | Source: Ambulatory Visit | Attending: Internal Medicine | Admitting: Internal Medicine

## 2016-12-21 DIAGNOSIS — Z1231 Encounter for screening mammogram for malignant neoplasm of breast: Secondary | ICD-10-CM

## 2016-12-28 DIAGNOSIS — N200 Calculus of kidney: Secondary | ICD-10-CM | POA: Diagnosis not present

## 2016-12-28 DIAGNOSIS — Z6841 Body Mass Index (BMI) 40.0 and over, adult: Secondary | ICD-10-CM | POA: Diagnosis not present

## 2016-12-28 DIAGNOSIS — K200 Eosinophilic esophagitis: Secondary | ICD-10-CM | POA: Diagnosis not present

## 2016-12-28 DIAGNOSIS — F410 Panic disorder [episodic paroxysmal anxiety] without agoraphobia: Secondary | ICD-10-CM | POA: Diagnosis not present

## 2016-12-28 DIAGNOSIS — E785 Hyperlipidemia, unspecified: Secondary | ICD-10-CM | POA: Diagnosis not present

## 2017-02-22 DIAGNOSIS — H5203 Hypermetropia, bilateral: Secondary | ICD-10-CM | POA: Diagnosis not present

## 2017-02-22 DIAGNOSIS — H524 Presbyopia: Secondary | ICD-10-CM | POA: Diagnosis not present

## 2017-04-04 DIAGNOSIS — D1801 Hemangioma of skin and subcutaneous tissue: Secondary | ICD-10-CM | POA: Diagnosis not present

## 2017-04-04 DIAGNOSIS — D225 Melanocytic nevi of trunk: Secondary | ICD-10-CM | POA: Diagnosis not present

## 2017-04-04 DIAGNOSIS — L814 Other melanin hyperpigmentation: Secondary | ICD-10-CM | POA: Diagnosis not present

## 2017-04-04 DIAGNOSIS — L821 Other seborrheic keratosis: Secondary | ICD-10-CM | POA: Diagnosis not present

## 2017-05-02 DIAGNOSIS — L609 Nail disorder, unspecified: Secondary | ICD-10-CM | POA: Diagnosis not present

## 2017-05-02 DIAGNOSIS — M79672 Pain in left foot: Secondary | ICD-10-CM | POA: Diagnosis not present

## 2017-05-02 DIAGNOSIS — B351 Tinea unguium: Secondary | ICD-10-CM | POA: Diagnosis not present

## 2017-05-05 DIAGNOSIS — M79672 Pain in left foot: Secondary | ICD-10-CM | POA: Diagnosis not present

## 2017-07-10 DIAGNOSIS — Z23 Encounter for immunization: Secondary | ICD-10-CM | POA: Diagnosis not present

## 2017-11-13 DIAGNOSIS — H9190 Unspecified hearing loss, unspecified ear: Secondary | ICD-10-CM | POA: Diagnosis not present

## 2017-11-13 DIAGNOSIS — K219 Gastro-esophageal reflux disease without esophagitis: Secondary | ICD-10-CM | POA: Diagnosis not present

## 2017-11-22 ENCOUNTER — Other Ambulatory Visit: Payer: Self-pay | Admitting: Internal Medicine

## 2017-11-22 DIAGNOSIS — Z1231 Encounter for screening mammogram for malignant neoplasm of breast: Secondary | ICD-10-CM

## 2017-11-28 DIAGNOSIS — H9193 Unspecified hearing loss, bilateral: Secondary | ICD-10-CM | POA: Diagnosis not present

## 2017-11-28 DIAGNOSIS — H9113 Presbycusis, bilateral: Secondary | ICD-10-CM | POA: Diagnosis not present

## 2017-12-25 ENCOUNTER — Ambulatory Visit
Admission: RE | Admit: 2017-12-25 | Discharge: 2017-12-25 | Disposition: A | Discharge status: Home / Self Care | Payer: BLUE CROSS/BLUE SHIELD | Source: Ambulatory Visit | Attending: Internal Medicine | Admitting: Internal Medicine

## 2017-12-25 DIAGNOSIS — Z1231 Encounter for screening mammogram for malignant neoplasm of breast: Secondary | ICD-10-CM | POA: Diagnosis not present

## 2017-12-28 DIAGNOSIS — E785 Hyperlipidemia, unspecified: Secondary | ICD-10-CM | POA: Diagnosis not present

## 2017-12-28 DIAGNOSIS — Z6841 Body Mass Index (BMI) 40.0 and over, adult: Secondary | ICD-10-CM | POA: Diagnosis not present

## 2017-12-28 DIAGNOSIS — K635 Polyp of colon: Secondary | ICD-10-CM | POA: Diagnosis not present

## 2018-02-28 DIAGNOSIS — Z6832 Body mass index (BMI) 32.0-32.9, adult: Secondary | ICD-10-CM | POA: Diagnosis not present

## 2018-02-28 DIAGNOSIS — Z01419 Encounter for gynecological examination (general) (routine) without abnormal findings: Secondary | ICD-10-CM | POA: Diagnosis not present

## 2018-02-28 DIAGNOSIS — R6882 Decreased libido: Secondary | ICD-10-CM | POA: Diagnosis not present

## 2018-02-28 DIAGNOSIS — N9089 Other specified noninflammatory disorders of vulva and perineum: Secondary | ICD-10-CM | POA: Diagnosis not present

## 2018-04-18 DIAGNOSIS — D239 Other benign neoplasm of skin, unspecified: Secondary | ICD-10-CM | POA: Diagnosis not present

## 2018-04-18 DIAGNOSIS — L720 Epidermal cyst: Secondary | ICD-10-CM | POA: Diagnosis not present

## 2018-04-18 DIAGNOSIS — L821 Other seborrheic keratosis: Secondary | ICD-10-CM | POA: Diagnosis not present

## 2018-04-18 DIAGNOSIS — Z411 Encounter for cosmetic surgery: Secondary | ICD-10-CM | POA: Diagnosis not present

## 2018-07-11 DIAGNOSIS — Z23 Encounter for immunization: Secondary | ICD-10-CM | POA: Diagnosis not present

## 2018-08-21 DIAGNOSIS — J310 Chronic rhinitis: Secondary | ICD-10-CM | POA: Diagnosis not present

## 2018-08-21 DIAGNOSIS — K2981 Duodenitis with bleeding: Secondary | ICD-10-CM | POA: Diagnosis not present

## 2018-10-02 DIAGNOSIS — J209 Acute bronchitis, unspecified: Secondary | ICD-10-CM | POA: Diagnosis not present

## 2018-11-20 ENCOUNTER — Encounter: Payer: Self-pay | Admitting: Internal Medicine

## 2018-12-18 ENCOUNTER — Ambulatory Visit: Payer: BLUE CROSS/BLUE SHIELD | Admitting: Internal Medicine

## 2019-01-07 DIAGNOSIS — F419 Anxiety disorder, unspecified: Secondary | ICD-10-CM | POA: Diagnosis not present

## 2019-06-10 ENCOUNTER — Other Ambulatory Visit: Payer: Self-pay | Admitting: Internal Medicine

## 2019-06-10 DIAGNOSIS — Z1231 Encounter for screening mammogram for malignant neoplasm of breast: Secondary | ICD-10-CM

## 2019-06-12 DIAGNOSIS — F4323 Adjustment disorder with mixed anxiety and depressed mood: Secondary | ICD-10-CM | POA: Diagnosis not present

## 2019-06-17 DIAGNOSIS — F4323 Adjustment disorder with mixed anxiety and depressed mood: Secondary | ICD-10-CM | POA: Diagnosis not present

## 2019-06-20 DIAGNOSIS — E7800 Pure hypercholesterolemia, unspecified: Secondary | ICD-10-CM | POA: Diagnosis not present

## 2019-06-20 DIAGNOSIS — K635 Polyp of colon: Secondary | ICD-10-CM | POA: Diagnosis not present

## 2019-06-20 DIAGNOSIS — F410 Panic disorder [episodic paroxysmal anxiety] without agoraphobia: Secondary | ICD-10-CM | POA: Diagnosis not present

## 2019-06-20 DIAGNOSIS — N200 Calculus of kidney: Secondary | ICD-10-CM | POA: Diagnosis not present

## 2019-06-25 DIAGNOSIS — F4323 Adjustment disorder with mixed anxiety and depressed mood: Secondary | ICD-10-CM | POA: Diagnosis not present

## 2019-07-03 DIAGNOSIS — F4323 Adjustment disorder with mixed anxiety and depressed mood: Secondary | ICD-10-CM | POA: Diagnosis not present

## 2019-07-16 DIAGNOSIS — F4323 Adjustment disorder with mixed anxiety and depressed mood: Secondary | ICD-10-CM | POA: Diagnosis not present

## 2019-07-26 ENCOUNTER — Other Ambulatory Visit: Payer: Self-pay

## 2019-07-26 ENCOUNTER — Ambulatory Visit
Admission: RE | Admit: 2019-07-26 | Discharge: 2019-07-26 | Disposition: A | Discharge status: Home / Self Care | Payer: BLUE CROSS/BLUE SHIELD | Source: Ambulatory Visit | Attending: Internal Medicine | Admitting: Internal Medicine

## 2019-07-26 DIAGNOSIS — Z1231 Encounter for screening mammogram for malignant neoplasm of breast: Secondary | ICD-10-CM | POA: Diagnosis not present

## 2019-08-05 DIAGNOSIS — F4323 Adjustment disorder with mixed anxiety and depressed mood: Secondary | ICD-10-CM | POA: Diagnosis not present

## 2019-08-20 DIAGNOSIS — F4323 Adjustment disorder with mixed anxiety and depressed mood: Secondary | ICD-10-CM | POA: Diagnosis not present

## 2019-08-29 DIAGNOSIS — F4323 Adjustment disorder with mixed anxiety and depressed mood: Secondary | ICD-10-CM | POA: Diagnosis not present

## 2019-09-06 DIAGNOSIS — R42 Dizziness and giddiness: Secondary | ICD-10-CM | POA: Diagnosis not present

## 2019-09-06 DIAGNOSIS — I491 Atrial premature depolarization: Secondary | ICD-10-CM | POA: Diagnosis not present

## 2019-09-26 DIAGNOSIS — F4323 Adjustment disorder with mixed anxiety and depressed mood: Secondary | ICD-10-CM | POA: Diagnosis not present

## 2019-10-24 DIAGNOSIS — F4323 Adjustment disorder with mixed anxiety and depressed mood: Secondary | ICD-10-CM | POA: Diagnosis not present

## 2019-10-29 ENCOUNTER — Ambulatory Visit: Payer: BC Managed Care – PPO | Attending: Internal Medicine

## 2019-10-29 DIAGNOSIS — Z23 Encounter for immunization: Secondary | ICD-10-CM | POA: Insufficient documentation

## 2019-10-29 NOTE — Progress Notes (Signed)
   Covid-19 Vaccination Clinic  Name:  Abigail Byrd    MRN: [id number] DOB: [birth year]  10/29/2019  Abigail Byrd was observed post Covid-19 immunization for 15 minutes without incidence. She was provided with Vaccine Information Sheet and instruction to access the V-Safe system.   Abigail Byrd was instructed to call 911 with any severe reactions post vaccine: Marland Kitchen Difficulty breathing  . Swelling of your face and throat  . A fast heartbeat  . A bad rash all over your body  . Dizziness and weakness    Immunizations Administered    Name Date Dose VIS Date Route   Pfizer COVID-19 Vaccine 10/29/2019 10:40 AM 0.3 mL 08/30/2019 Intramuscular   Manufacturer: [employer], [employer]   Lot: [id number]   NDC: [address]-0

## 2019-10-31 ENCOUNTER — Ambulatory Visit: Payer: BC Managed Care – PPO

## 2019-11-05 DIAGNOSIS — F4323 Adjustment disorder with mixed anxiety and depressed mood: Secondary | ICD-10-CM | POA: Diagnosis not present

## 2019-11-19 DIAGNOSIS — F4323 Adjustment disorder with mixed anxiety and depressed mood: Secondary | ICD-10-CM | POA: Diagnosis not present

## 2019-11-24 ENCOUNTER — Ambulatory Visit: Payer: BC Managed Care – PPO | Attending: Internal Medicine

## 2019-11-24 DIAGNOSIS — Z23 Encounter for immunization: Secondary | ICD-10-CM

## 2019-11-24 NOTE — Progress Notes (Signed)
   Covid-19 Vaccination Clinic  Name:  DANYELL SHADER    MRN: [id number] DOB: [birth year]  11/24/2019  Ms. Naab was observed post Covid-19 immunization for 30 minutes based on pre-vaccination screening without incident. She was provided with Vaccine Information Sheet and instruction to access the V-Safe system.   Ms. Henkes was instructed to call 911 with any severe reactions post vaccine: Marland Kitchen Difficulty breathing  . Swelling of face and throat  . A fast heartbeat  . A bad rash all over body  . Dizziness and weakness   Immunizations Administered    Name Date Dose VIS Date Route   Pfizer COVID-19 Vaccine 11/24/2019  9:02 AM 0.3 mL 08/30/2019 Intramuscular   Manufacturer: [employer], [employer]   Lot: [id number]   NDC: [address]-5

## 2019-12-03 DIAGNOSIS — F4323 Adjustment disorder with mixed anxiety and depressed mood: Secondary | ICD-10-CM | POA: Diagnosis not present

## 2019-12-12 DIAGNOSIS — M25561 Pain in right knee: Secondary | ICD-10-CM | POA: Diagnosis not present

## 2019-12-12 DIAGNOSIS — G5603 Carpal tunnel syndrome, bilateral upper limbs: Secondary | ICD-10-CM | POA: Diagnosis not present

## 2019-12-12 DIAGNOSIS — M19049 Primary osteoarthritis, unspecified hand: Secondary | ICD-10-CM | POA: Diagnosis not present

## 2019-12-12 DIAGNOSIS — Z1331 Encounter for screening for depression: Secondary | ICD-10-CM | POA: Diagnosis not present

## 2019-12-12 DIAGNOSIS — K200 Eosinophilic esophagitis: Secondary | ICD-10-CM | POA: Diagnosis not present

## 2019-12-17 DIAGNOSIS — F4323 Adjustment disorder with mixed anxiety and depressed mood: Secondary | ICD-10-CM | POA: Diagnosis not present

## 2019-12-30 DIAGNOSIS — F4323 Adjustment disorder with mixed anxiety and depressed mood: Secondary | ICD-10-CM | POA: Diagnosis not present

## 2020-01-08 DIAGNOSIS — F4323 Adjustment disorder with mixed anxiety and depressed mood: Secondary | ICD-10-CM | POA: Diagnosis not present

## 2020-01-14 DIAGNOSIS — H5203 Hypermetropia, bilateral: Secondary | ICD-10-CM | POA: Diagnosis not present

## 2020-01-14 DIAGNOSIS — H52203 Unspecified astigmatism, bilateral: Secondary | ICD-10-CM | POA: Diagnosis not present

## 2020-01-21 DIAGNOSIS — L304 Erythema intertrigo: Secondary | ICD-10-CM | POA: Diagnosis not present

## 2020-01-21 DIAGNOSIS — L309 Dermatitis, unspecified: Secondary | ICD-10-CM | POA: Diagnosis not present

## 2020-02-26 DIAGNOSIS — D225 Melanocytic nevi of trunk: Secondary | ICD-10-CM | POA: Diagnosis not present

## 2020-02-26 DIAGNOSIS — L814 Other melanin hyperpigmentation: Secondary | ICD-10-CM | POA: Diagnosis not present

## 2020-02-26 DIAGNOSIS — L821 Other seborrheic keratosis: Secondary | ICD-10-CM | POA: Diagnosis not present

## 2020-02-26 DIAGNOSIS — L578 Other skin changes due to chronic exposure to nonionizing radiation: Secondary | ICD-10-CM | POA: Diagnosis not present

## 2020-03-10 DIAGNOSIS — Z6834 Body mass index (BMI) 34.0-34.9, adult: Secondary | ICD-10-CM | POA: Diagnosis not present

## 2020-03-10 DIAGNOSIS — Z01419 Encounter for gynecological examination (general) (routine) without abnormal findings: Secondary | ICD-10-CM | POA: Diagnosis not present

## 2020-03-11 DIAGNOSIS — M25561 Pain in right knee: Secondary | ICD-10-CM | POA: Diagnosis not present

## 2020-03-12 ENCOUNTER — Other Ambulatory Visit: Payer: Self-pay | Admitting: Internal Medicine

## 2020-03-12 ENCOUNTER — Other Ambulatory Visit: Payer: Self-pay | Admitting: Obstetrics & Gynecology

## 2020-03-12 DIAGNOSIS — E2839 Other primary ovarian failure: Secondary | ICD-10-CM

## 2020-03-12 DIAGNOSIS — Z1231 Encounter for screening mammogram for malignant neoplasm of breast: Secondary | ICD-10-CM

## 2020-03-16 DIAGNOSIS — M25561 Pain in right knee: Secondary | ICD-10-CM | POA: Diagnosis not present

## 2020-03-25 DIAGNOSIS — M25561 Pain in right knee: Secondary | ICD-10-CM | POA: Diagnosis not present

## 2020-04-01 DIAGNOSIS — M25561 Pain in right knee: Secondary | ICD-10-CM | POA: Diagnosis not present

## 2020-04-15 DIAGNOSIS — M25561 Pain in right knee: Secondary | ICD-10-CM | POA: Diagnosis not present

## 2020-05-27 DIAGNOSIS — J029 Acute pharyngitis, unspecified: Secondary | ICD-10-CM | POA: Diagnosis not present

## 2020-05-27 DIAGNOSIS — Z20822 Contact with and (suspected) exposure to covid-19: Secondary | ICD-10-CM | POA: Diagnosis not present

## 2020-06-01 DIAGNOSIS — Z20822 Contact with and (suspected) exposure to covid-19: Secondary | ICD-10-CM | POA: Diagnosis not present

## 2020-06-22 DIAGNOSIS — E785 Hyperlipidemia, unspecified: Secondary | ICD-10-CM | POA: Diagnosis not present

## 2020-06-22 DIAGNOSIS — Z0000 Encounter for general adult medical examination without abnormal findings: Secondary | ICD-10-CM | POA: Diagnosis not present

## 2020-06-23 DIAGNOSIS — R7303 Prediabetes: Secondary | ICD-10-CM | POA: Diagnosis not present

## 2020-06-30 DIAGNOSIS — Z0000 Encounter for general adult medical examination without abnormal findings: Secondary | ICD-10-CM | POA: Diagnosis not present

## 2020-06-30 DIAGNOSIS — R0600 Dyspnea, unspecified: Secondary | ICD-10-CM | POA: Diagnosis not present

## 2020-06-30 DIAGNOSIS — Z23 Encounter for immunization: Secondary | ICD-10-CM | POA: Diagnosis not present

## 2020-06-30 DIAGNOSIS — K200 Eosinophilic esophagitis: Secondary | ICD-10-CM | POA: Diagnosis not present

## 2020-07-13 DIAGNOSIS — Z1212 Encounter for screening for malignant neoplasm of rectum: Secondary | ICD-10-CM | POA: Diagnosis not present

## 2020-07-14 DIAGNOSIS — L249 Irritant contact dermatitis, unspecified cause: Secondary | ICD-10-CM | POA: Diagnosis not present

## 2020-07-27 ENCOUNTER — Ambulatory Visit
Admission: RE | Admit: 2020-07-27 | Discharge: 2020-07-27 | Disposition: A | Discharge status: Home / Self Care | Payer: BC Managed Care – PPO | Source: Ambulatory Visit | Attending: Internal Medicine | Admitting: Internal Medicine

## 2020-07-27 ENCOUNTER — Ambulatory Visit
Admission: RE | Admit: 2020-07-27 | Discharge: 2020-07-27 | Disposition: A | Discharge status: Home / Self Care | Payer: BC Managed Care – PPO | Source: Ambulatory Visit | Attending: Obstetrics & Gynecology | Admitting: Obstetrics & Gynecology

## 2020-07-27 ENCOUNTER — Other Ambulatory Visit: Payer: Self-pay

## 2020-07-27 DIAGNOSIS — M8588 Other specified disorders of bone density and structure, other site: Secondary | ICD-10-CM | POA: Diagnosis not present

## 2020-07-27 DIAGNOSIS — Z780 Asymptomatic menopausal state: Secondary | ICD-10-CM | POA: Diagnosis not present

## 2020-07-27 DIAGNOSIS — Z1231 Encounter for screening mammogram for malignant neoplasm of breast: Secondary | ICD-10-CM

## 2020-07-27 DIAGNOSIS — E2839 Other primary ovarian failure: Secondary | ICD-10-CM

## 2020-12-24 DIAGNOSIS — R7303 Prediabetes: Secondary | ICD-10-CM | POA: Diagnosis not present

## 2020-12-24 DIAGNOSIS — E785 Hyperlipidemia, unspecified: Secondary | ICD-10-CM | POA: Diagnosis not present

## 2020-12-24 DIAGNOSIS — Z23 Encounter for immunization: Secondary | ICD-10-CM | POA: Diagnosis not present

## 2020-12-24 DIAGNOSIS — F419 Anxiety disorder, unspecified: Secondary | ICD-10-CM | POA: Diagnosis not present

## 2021-01-14 DIAGNOSIS — H2513 Age-related nuclear cataract, bilateral: Secondary | ICD-10-CM | POA: Diagnosis not present

## 2021-01-14 DIAGNOSIS — H5203 Hypermetropia, bilateral: Secondary | ICD-10-CM | POA: Diagnosis not present

## 2021-01-14 DIAGNOSIS — H524 Presbyopia: Secondary | ICD-10-CM | POA: Diagnosis not present

## 2021-02-08 NOTE — Progress Notes (Signed)
Telehealth Encounter (Email link to [email or web address] ) I connected with Abigail Byrd (MRN [id number]) on 02/09/2021 by phone (after patient did not receive twice-emailed invitation from [facility]), verified that I was speaking with the correct person using two identifiers, and that the patient was in a private environment conducive to confidentiality.  The patient agreed to proceed.  Persons participating in visit were patient and provider (registered dietitian) Linna Darner, PhD, RD, LDN, CEDRD.  Provider was located at [facility] during this telehealth encounter; patient was at home.  Appt start time: 1000 end time: 1100 (1 hour)  Reason for telehealth visit: Referred by PCP Gweneth Dimitri, MD for Medical Nutrition Therapy related to pre-DM, obesity, and hyperlipidemia.  Relevant history/background: Jonnette's A1c was 5.8 in 2021, which followed wt gain of ~15 lb and less exercise during the pandemic.  Desite having started to lose weight, A1c had increased to 6.3 by April 2022.  She has now started making very careful food choices, following Weight Watchers eating plan.   Assessment:  Weight 210 lb (self-rept).  Ht is 69"; BMI 31.01.  Usual eating pattern: 3 meals and 0-2 snacks per day. Frequent foods and beverages: water, decaf coffee (w/ 1-2 tbsp whole milk 2 X wk), (2 glasses wine/week); breakfst of l-f yogurt, fruit, oats, walnuts OR egg & sauteed kale; chx, veg's, hummus, olive oil.   Avoided foods: breads, baked goods, desserts, fried foods, fast foods.   Usual physical activity: walking 1-3 mi (20-min mile pace) 7 X wk; 90 min doubles tennis 3-4 X wk.  Had been doing yoga 4 X wk and wt training 2 X wk, which she hopes to get back to after tennis season ends (fall/winter).   Sleep: Estimates average of 7-8 hours of sleep/night.  24-hr recall:  (Up at 7 AM; drank water) B (9 AM)-  5 oz low-fat Grk plain yogurt, 1 c blkberries, 2 tbsp oats, 4 walnut halves, water Snk  ( AM)-  ---  L (12 PM)-  2 chx tenders, 4 tbsp hummus, 1/2 c carrots, 2 small cucumbers, water Snk (4 PM)-  1 banana, 2 tbsp peanut butter, 1 tbsp chopped peanuts, water D (6 PM)-  3 oz salmon (olive oil), 1 c asparagus (olive oil), 1 c brn rice, 1 tsp butter, water Snk ( PM)-  --- Typical day? Yes.   Drinks 32-48 oz water/day.   Intervention: Completed diet and exercise history, and established behavioral goals.   For recommendations and goals, see Patient Instructions.    Follow-up: In-office appt in 4 weeks.     Charmel Pronovost,[name]

## 2021-02-09 ENCOUNTER — Ambulatory Visit (INDEPENDENT_AMBULATORY_CARE_PROVIDER_SITE_OTHER): Payer: BC Managed Care – PPO | Admitting: Family Medicine

## 2021-02-09 ENCOUNTER — Other Ambulatory Visit: Payer: Self-pay

## 2021-02-09 ENCOUNTER — Encounter: Payer: Self-pay | Admitting: Family Medicine

## 2021-02-09 DIAGNOSIS — R7303 Prediabetes: Secondary | ICD-10-CM | POA: Diagnosis not present

## 2021-02-09 DIAGNOSIS — Z1152 Encounter for screening for COVID-19: Secondary | ICD-10-CM | POA: Diagnosis not present

## 2021-02-09 NOTE — Patient Instructions (Addendum)
Our insulin gets less sensitive as the day goes on.  That means that it makes sense to eat most of your carb earlier in the day vs. later.     Diet Recommendations for Diabetes  Carbohydrate includes starch, sugar, and fiber.  Of these, only sugar and starch raise blood glucose.  (Fiber is found in fruits, vegetables [especially skin, seeds, and stalks], whole grains, and beans.)   Starchy (carb) foods: Bread, rice, pasta, potatoes, corn, cereal, grits, crackers, bagels, muffins, all baked goods.  (Fruit, milk, and yogurt also have carbohydrate, but most of these foods will not spike your blood sugar as most starchy or sweet foods will.)  A few fruits do cause high blood sugars; use small portions of bananas (limit to 1/2 at a time), grapes, watermelon, and oranges.   Protein foods: Meat, fish, poultry, eggs, dairy foods, and beans such as pinto and kidney beans (beans also provide carbohydrate).   1. Eat at least 3 REAL meals and 1-2 snacks per day. Eat breakfast within the first hour of getting up.  Have something to eat at least every 5 hours while awake.  A "real" meal includes a source of protein, fruit and/or veg's and usually a limited amount of  starch.   2. Limit starchy foods to TWO per meal and ONE per snack. ONE portion of a starchy food is equal to the following:   - ONE slice of bread (or its equivalent, such as half of a hamburger bun).   - 1/2 cup of a "scoopable" starchy food such as potatoes or rice.   - 15 grams of Total Carbohydrate as shown on food label.   - Every 4 ounces of a sweet drink (including fruit juice). 3. Include twice the volume of veg's as protein or carbohydrate foods for BOTH lunch and dinner as often as you can.    - Fresh or frozen vegetables are best.   - Keep frozen vegetables on hand for a quick option.   4. Continue your current excellent exercise regimen.  Note:  Exercise after the consumption of carb foods helps limit blood glucose rise.    Follow-up  in-person appt on Monday, June 27 at 2:30 PM.

## 2021-03-04 DIAGNOSIS — J45909 Unspecified asthma, uncomplicated: Secondary | ICD-10-CM | POA: Diagnosis not present

## 2021-03-04 DIAGNOSIS — U071 COVID-19: Secondary | ICD-10-CM | POA: Diagnosis not present

## 2021-03-15 ENCOUNTER — Ambulatory Visit: Payer: BC Managed Care – PPO | Admitting: Family Medicine

## 2021-04-05 DIAGNOSIS — E785 Hyperlipidemia, unspecified: Secondary | ICD-10-CM | POA: Diagnosis not present

## 2021-04-05 DIAGNOSIS — R7303 Prediabetes: Secondary | ICD-10-CM | POA: Diagnosis not present

## 2021-04-06 ENCOUNTER — Ambulatory Visit: Payer: BC Managed Care – PPO | Admitting: Family Medicine

## 2021-04-08 ENCOUNTER — Ambulatory Visit (INDEPENDENT_AMBULATORY_CARE_PROVIDER_SITE_OTHER): Payer: BC Managed Care – PPO | Admitting: Family Medicine

## 2021-04-08 ENCOUNTER — Other Ambulatory Visit: Payer: Self-pay

## 2021-04-08 DIAGNOSIS — H43811 Vitreous degeneration, right eye: Secondary | ICD-10-CM | POA: Diagnosis not present

## 2021-04-08 DIAGNOSIS — Z713 Dietary counseling and surveillance: Secondary | ICD-10-CM

## 2021-04-08 DIAGNOSIS — R7303 Prediabetes: Secondary | ICD-10-CM

## 2021-04-08 NOTE — Patient Instructions (Addendum)
Goals remain the same:  1. Eat at least 3 REAL meals and 1-2 snacks per day. A "real" meal includes a source of protein, fruit and/or veg's and usually a limited amount of starch.   2. Limit starchy foods to TWO per meal and ONE per snack. ONE portion of a starchy food is equal to the following:             - ONE slice of bread (or its equivalent, such as half of a hamburger bun).             - 1/2 cup of a "scoopable" starchy food such as potatoes or rice.             - 15 grams of Total Carbohydrate as shown on food label.  - (The Weight Watcher's point system should also work to identify one carb portion.) 3. Include twice the volume of veg's as protein or carbohydrate foods for BOTH lunch and dinner as often as you can.               - Fresh or frozen vegetables are best.             - Keep frozen vegetables on hand for a quick option.    - Document progress on above goals using the Goals Sheet provided today.  You can just use check marks, or for Goal #2, for example, you may want to write B, L, or D, indicating the meal for which you met that goal.  This will show any trends, i.e., meals incomplete more often at breakfast or lunch.    - Complete the Meal Planning form (dinner section) after you have listed foods you like on the three columns on the second page.  These meals should be relatively quick and easy to prepare.  Use your list as a basis for shopping, so you have those ingredients on hand most of the time.    - Protein foods: Each one egg and each ounce of meat, fish, poultry, cheese = 7 grams protein.  8 ounces of milk = 8 g protein.  1 cup of most beans = 15 g protein.    - There is some evidence that acidic foods (vinegar, yogurt, lemon, lime) eaten at the same time as a carb food helps to blunt the rise in blood sugar.     - Keep in mind that carb at breakfast makes a lot of sense b/c you have fasted overnight, so blood sugar levels are typically low.

## 2021-04-08 NOTE — Progress Notes (Signed)
Telehealth Encounter (Email link to [email or web address] ) I connected with ARLINDA BARCELONA (MRN [id number]) on 04/08/2021 by phone (after patient AGAIN did not receive twice-emailed invitation from [facility]),verified that I was speaking with the correct person using two identifiers, and that the patient was in a private environment conducive to confidentiality.  The patient agreed to proceed.  Persons participating in visit were the patient and provider (registered dietitian) Linna Darner, PhD, RD, LDN, CEDRD.  Provider was located at [facility] during this telehealth encounter; patient was at home.  Appt start time: 1430 end time: 1530 (1 hour)  Reason for telehealth visit: Referred by PCP Gweneth Dimitri, MD for Medical Nutrition Therapy related to pre-DM, obesity, and hyperlipidemia.  Relevant history/background: Rafaela's A1c was 5.8 in 2021, which followed wt gain of ~15 lb and less exercise during the pandemic.  Desite having started to lose weight, A1c had increased to 6.3 by April 2022.  She has now started making very careful food choices, following Weight Watchers eating plan.    Assessment: Kenley has been eating more veg's usually twice daily.  She had been restricting wine intake to 1 glass 2 X wk, but has increased frequency somewhat in recent weeks.  She reports getting tired of eating the same/similar meals.  She had her A1c measured recently, but has not been able to access results through Dr. Darrell Jewel office.   Weight is stable ~210 lb (self-rept).  Ht is 69"; BMI 31.01.  Usual eating pattern: 3 meals and 0-2 snacks per day. Recent physical activity: Could not exercise much when COVID-positive a few weeks ago, although she maintained walking 1-3 mi daily.  She is now also back to 90 min doubles tennis 3-4 X wk.  Has been more tired than usual; not sure if COVID- or heat-related.   Sleep: Estimates average of 7-8 hours of sleep/night.  24-hr recall:  (Up at 6 AM;  tennis from 8:30 to 10; water) B (10:30 AM)-  1/2 c Grapenuts, 3 tbsp c whole milk, 1 peach Snk ( AM)-  --- L (1 PM)-  1 chx kabob, salad, 1 tbsp drsng, 1/3 c rice, 2 tbsp hummus, water Snk ( PM)-  --- - Tennis match at 7:30 PM; drank water -  D (9 PM)-  1 rice cake, 1 tbsp peanut butter, water Snk ( PM)-  --- Typical day? No.  More typical would be dinner of salad w/ walnuts and goat chs, & chicken w/ no starch; lunch of [place] burger, cucumber, carrots, & 2 tbsp hummus; bkfst of 1/2 c (dry) oatmeal w/ fruit +/- peanut butter.    Intervention: Completed diet and exercise history, and confirmed behavioral goals.   For recommendations and goals, see Patient Instructions.    Follow-up: In-office appt in 4 weeks.     Chantae Soo,[name]

## 2021-04-12 DIAGNOSIS — H43811 Vitreous degeneration, right eye: Secondary | ICD-10-CM | POA: Diagnosis not present

## 2021-04-12 DIAGNOSIS — H43813 Vitreous degeneration, bilateral: Secondary | ICD-10-CM | POA: Diagnosis not present

## 2021-04-12 DIAGNOSIS — H2513 Age-related nuclear cataract, bilateral: Secondary | ICD-10-CM | POA: Diagnosis not present

## 2021-04-19 DIAGNOSIS — H2513 Age-related nuclear cataract, bilateral: Secondary | ICD-10-CM | POA: Diagnosis not present

## 2021-04-19 DIAGNOSIS — H43813 Vitreous degeneration, bilateral: Secondary | ICD-10-CM | POA: Diagnosis not present

## 2021-04-22 DIAGNOSIS — D225 Melanocytic nevi of trunk: Secondary | ICD-10-CM | POA: Diagnosis not present

## 2021-04-22 DIAGNOSIS — L821 Other seborrheic keratosis: Secondary | ICD-10-CM | POA: Diagnosis not present

## 2021-04-22 DIAGNOSIS — L814 Other melanin hyperpigmentation: Secondary | ICD-10-CM | POA: Diagnosis not present

## 2021-04-22 DIAGNOSIS — L578 Other skin changes due to chronic exposure to nonionizing radiation: Secondary | ICD-10-CM | POA: Diagnosis not present

## 2021-05-10 ENCOUNTER — Encounter: Payer: Self-pay | Admitting: Family Medicine

## 2021-05-10 ENCOUNTER — Ambulatory Visit (INDEPENDENT_AMBULATORY_CARE_PROVIDER_SITE_OTHER): Payer: BC Managed Care – PPO | Admitting: Family Medicine

## 2021-05-10 ENCOUNTER — Other Ambulatory Visit: Payer: Self-pay

## 2021-05-10 DIAGNOSIS — R7303 Prediabetes: Secondary | ICD-10-CM
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
## 2021-05-10 NOTE — Patient Instructions (Signed)
-   Resistance exercise: Check into outdoor exercise classes at [facility] parking lot.    - Limit fruit portions to what is generally considered a normal portion size, i.e., 1 piece of fruit or ~1 cup of cut fruit.   - Most fruit is low to moderate-glycemic, but high-glycemic fruits include bananas, watermelon, grapes, and oranges.    - For the next two weeks, record what time you get up and your breakfast: WHAT, HOW MUCH, and WHAT TIME you eat.  Then also record the FIRST time you notice hunger during the day.  This can help you determine what breakfast sustains you best through the morning.    - Any exercise right after eating helps to blunt the rise in blood sugar.    - Snacks: Incorporate protein with carb; consider the best carb source, i.e., fresh fruit vs. rice cake.    Goals remain the same:  1. Eat at least 3 REAL meals and 1-2 snacks per day. A "real" lunch or dinner includes a source of protein, veg's, and usually a limited amount of starch.  (Breakfast needs to include a protein and a starch, and fruit is optional.  You can experiment with this; there may be times when you want to use fruit in place of your starch choice for breakfast.) 2. Limit starchy foods to TWO per meal and ONE per snack. ONE portion of a starchy food is equal to the following:             - ONE slice of bread (or its equivalent, such as half of a hamburger bun).             - 1/2 cup of a "scoopable" starchy food such as potatoes or rice.             - 15 grams of Total Carbohydrate as shown on food label.             - (The Weight Watcher's point system should also work to identify one carb portion.) 3. Set aside a few minutes once a week to determine a new recipe to try.    Follow-up in-office appt on Monday, Sept 19.

## 2021-05-17 DIAGNOSIS — H43811 Vitreous degeneration, right eye: Secondary | ICD-10-CM | POA: Diagnosis not present

## 2021-05-17 DIAGNOSIS — H35351 Cystoid macular degeneration, right eye: Secondary | ICD-10-CM | POA: Diagnosis not present

## 2021-06-07 ENCOUNTER — Ambulatory Visit (INDEPENDENT_AMBULATORY_CARE_PROVIDER_SITE_OTHER): Payer: BC Managed Care – PPO | Admitting: Family Medicine

## 2021-06-07 ENCOUNTER — Other Ambulatory Visit: Payer: Self-pay

## 2021-06-07 DIAGNOSIS — R7303 Prediabetes: Secondary | ICD-10-CM | POA: Diagnosis not present

## 2021-06-07 NOTE — Patient Instructions (Addendum)
Using your glucometer:   Check fasting blood glucose each day for the next couple of weeks.  Once you establish your "usual," if you notice an especially high or low reading, immediately write down what you last ate, how much, and what time it was.   If you check after meals, make sure it is consistently the same amount of time after each meal.    Emotional eating:  Make a list of alternatives to eating that you may use at times when you are otherwise drawn to eating.  Keep a copy of this list on your phone.    Goals remain the same:  1. Eat at least 3 REAL meals and 1-2 snacks per day. A "real" lunch or dinner includes a source of protein, veg's, and usually a limited amount of starch.  (Breakfast needs to include a protein and a starch, and fruit is optional.  You can experiment with this; there may be times when you want to use fruit in place of your starch choice for breakfast.) 2. Limit starchy foods to TWO per meal and ONE per snack. ONE portion of a starchy food is equal to the following:             - ONE slice of bread (or its equivalent, such as half of a hamburger bun).             - 1/2 cup of a "scoopable" starchy food such as potatoes or rice.             - 15 grams of Total Carbohydrate as shown on food label.             - (The Weight Watcher's point system should also work to identify one carb portion.) 3. Set aside a few minutes once a week to determine a new recipe to try.     Follow-up video appt on Tuesday, Oct 18 at 1:30 PM.

## 2021-06-07 NOTE — Progress Notes (Signed)
Telehealth Encounter (Email link to [email or web address] ) I connected with Abigail Byrd (MRN [id number]) on 06/07/2021 by Caregility video-enabled, HIPAA-compliant telemedicine application verified that I was speaking with the correct person using two identifiers, and that the patient was in a private environment conducive to confidentiality.  The patient agreed to proceed.  Persons participating in visit were the patient and provider (registered dietitian) Abigail Darner, PhD, RD, LDN, CEDRD.  Provider was located at [facility] during this telehealth encounter; patient was at home.  Appt start time: 1430 end time: 1530 (1 hour)  Reason for telehealth visit: Referred by PCP Gweneth Dimitri, MD for Medical Nutrition Therapy related to pre-DM, obesity, and hyperlipidemia.  Relevant history/background: Abigail Byrd's A1c was 5.8 in 2021, which followed wt gain of ~15 lb and less exercise during the pandemic.  Desite having started to lose weight, A1c had increased to 6.3 by April 2022.  She has now started making very careful food choices, following Weight Watchers eating plan.  A1c in late-July 2022 was 6.1.    Assessment: Abigail Byrd is interested in understanding her BG response to foods, so purchased a glucometer, but has been on vacation, and has not used it yet.  She has been drinking seltzer water with lime or lemon added.  Usually having 1 glass of wine twice a week.  A1c will be measured again on 06/21/21.  Weight: 207 lb (self-rept), down 3 lb in 1 month.  (Down ~28 lb in past 6-8 months.)  Ht is 69". Usual eating pattern: 3 meals and 0-2 snacks per day. Recent physical activity: Plays doubles tennis 90 min 3 X wk; walks 30-60 min 7 X wk; 45-60 min water aerobics 3-4 X wk.  Has not started resistance exercise yet.  The outdoor class she'd hoped to do has moved inside.  She has signed up for Zoom class 2 X wk, which includes weights.   Sleep: Estimates average of 7-8 hours of sleep/night.    24-hr recall:  (Up at 6:30 AM; 1 c coffee with 2 Tbsp whole milk) B (9 AM)-  1 apple, 1 tbsp PB Snk ( AM)-   - Walked to [employer] -  L (1 PM)-  1 egg & bean wrap, salad, drsng, water, 1 c coffee with 2 Tbsp whole milk Snk ( PM)-  --- D (7 PM)-  Caesar salad with chx and lite drsng, water Snk ( PM)-  1 tbsp PB Typical day? No. Just got back in town from vacation, so no groceries.  Usual lunch is bean burrito with small low-carb wrap, and bkfst is usually low-fat yogurt, fruit, and walnuts.    Intervention: Completed diet and exercise history, and discussed possible strategies for dealing with emotional eating.     For recommendations and goals, see Patient Instructions.    Follow-up: Video appt in 4 weeks.     [name],[name]

## 2021-06-24 DIAGNOSIS — R7303 Prediabetes: Secondary | ICD-10-CM | POA: Diagnosis not present

## 2021-06-24 DIAGNOSIS — E785 Hyperlipidemia, unspecified: Secondary | ICD-10-CM | POA: Diagnosis not present

## 2021-06-24 DIAGNOSIS — E559 Vitamin D deficiency, unspecified: Secondary | ICD-10-CM | POA: Diagnosis not present

## 2021-06-28 DIAGNOSIS — R7303 Prediabetes: Secondary | ICD-10-CM | POA: Diagnosis not present

## 2021-06-28 DIAGNOSIS — Z0000 Encounter for general adult medical examination without abnormal findings: Secondary | ICD-10-CM | POA: Diagnosis not present

## 2021-06-28 DIAGNOSIS — Z23 Encounter for immunization: Secondary | ICD-10-CM | POA: Diagnosis not present

## 2021-06-28 DIAGNOSIS — E785 Hyperlipidemia, unspecified: Secondary | ICD-10-CM | POA: Diagnosis not present

## 2021-07-06 ENCOUNTER — Other Ambulatory Visit: Payer: Self-pay

## 2021-07-06 ENCOUNTER — Ambulatory Visit (INDEPENDENT_AMBULATORY_CARE_PROVIDER_SITE_OTHER): Payer: BC Managed Care – PPO | Admitting: Family Medicine

## 2021-07-06 ENCOUNTER — Other Ambulatory Visit: Payer: Self-pay | Admitting: Family Medicine

## 2021-07-06 DIAGNOSIS — Z1231 Encounter for screening mammogram for malignant neoplasm of breast: Secondary | ICD-10-CM

## 2021-07-06 DIAGNOSIS — R7303 Prediabetes: Secondary | ICD-10-CM | POA: Diagnosis not present

## 2021-07-06 DIAGNOSIS — Z713 Dietary counseling and surveillance: Secondary | ICD-10-CM | POA: Diagnosis not present

## 2021-07-06 NOTE — Progress Notes (Signed)
Telehealth Encounter (Email link to [email or web address] ) PCP Gweneth Dimitri, MD I connected with DAMIYA SANDEFUR (MRN [id number]) on 07/06/2021 by Caregility video-enabled, HIPAA-compliant telemedicine application verified that I was speaking with the correct person using two identifiers, and that the patient was in a private environment conducive to confidentiality.  The patient agreed to proceed.  Persons participating in visit were the patient and provider (registered dietitian) Linna Darner, PhD, RD, LDN, CEDRD.  Provider was located at [facility] during this telehealth encounter; patient was at home.  Appt start time: 1330 end time: 1430 (1 hour)  Reason for telehealth visit: Referred by PCP Gweneth Dimitri, MD for Medical Nutrition Therapy related to pre-DM, obesity, and hyperlipidemia.  Relevant history/background: Copelyn's A1c was 5.8 in 2021, which followed wt gain of ~15 lb and less exercise during the pandemic.  Desite having started to lose weight, A1c had increased to 6.3 by April 2022.  She has now started making very careful food choices, following Weight Watchers eating plan.  A1c in late-July 2022 was 6.1.    Assessment: Kyndall has not yet been checking FBG, but still plans to; has been procrastinating.  A1c on 06/21/21 was 6.0, down 0.1 from last check.  Lipid panel was also greatly improved since April: TC 187 to 168; HDL 56 to 67; not sure re. LDL; and ratio went from 3.8 to 2.5.  She continues to take 500 mg metformin daily.  Food choices the past week were influenced by disappointment that weight, which had fallen to 204 lb, went back to 207, frustrating Nasia with all the effort she has invested, and overshadowing the good news she received re. A1c and lipid panel the week before.   Weight: 207 lb last week (self-rept). (Down ~28 lb in past 6-8 months.)  Ht is 69". Usual eating pattern: 3 meals and 0-2 snacks per day. Recent physical activity: Plays doubles tennis  90 min 3 X wk; walks 30-60 min 7 X wk; and online Zoom class 2 X wk, which includes weights.  Averaged ~11,000 daily steps over the last year.  24-hr recall:  (Up at 6:30 AM; drank water) B (10 AM)-   3/4 c raspberries, 1/3 oz plain Grk yogurt, 10 walnuts Snk ( AM)-  --- L (1 PM)-  5 oz ground [place], tom sauce, pickles, 1 oz on low-carb tortilla, 3/4 c Br sprouts, water Snk ( PM)-  --- D (7 PM)-  2 c zucchini spirals, 4 oz ground [place], spag sauce, 1 tbsp parmesan, water Snk ( PM)-  --- Typical day? Yes.    Intervention: Completed diet and exercise history, and encouraged attention to parameters other than weight as she continues to work on modifying her diet.    For recommendations and goals, see Patient Instructions.    Follow-up: Video appt in 6 weeks.     Shacora Zynda,[name]

## 2021-07-06 NOTE — Patient Instructions (Addendum)
-   Caution about how you interpret weight checks!  If your weight is up a couple pounds or more overnight, that cannot possibly be a gain of fat, and is most likely a reflection of water retained b/c of increased salt.    - Look for other online exercise options that are higher in intensity.  - Checking fasting blood sugars:  If a number is a lot higher or lower than usual, immediately write down what you last ate, how much, and what time.  You will eventually be able to detect trends based on what you ate the day before.    - Call to ask Dr. Corliss Blacker if you can measure A1c again at 3 months, which will help you with accountability and motivation.    - NOTE: Make sure you eat ENOUGH at breakfast, e.g., at least a full container of yogurt along with your fruit.    Document: - If you make a regretted food choice, write about what was behind that choice.   - Each time you do a high-intensity or strength workout.   - Have your documentation with you at your follow-up appt.    Goals remain the same:  1. Eat at least 3 REAL meals and 1-2 snacks per day. A "real" lunch or dinner includes a source of protein, veg's, and usually a limited amount of starch.  (Breakfast needs to include a protein and a starch, and fruit is optional.  You can experiment with this; there may be times when you want to use fruit in place of your starch choice for breakfast.) 2. Limit starchy foods to TWO per meal and ONE per snack. ONE portion of a starchy food is equal to the following:             - ONE slice of bread (or its equivalent, such as half of a hamburger bun).             - 1/2 cup of a "scoopable" starchy food such as potatoes or rice.             - 15 grams of Total Carbohydrate as shown on food label.             - (The Weight Watcher's point system should also work to identify one carb portion.) 3. Set aside a few minutes once a week to determine a new recipe to try.     Follow-up video appt on Tuesday,  November 29 at 1:30.

## 2021-07-12 DIAGNOSIS — H35421 Microcystoid degeneration of retina, right eye: Secondary | ICD-10-CM | POA: Diagnosis not present

## 2021-07-12 DIAGNOSIS — H43811 Vitreous degeneration, right eye: Secondary | ICD-10-CM | POA: Diagnosis not present

## 2021-08-02 ENCOUNTER — Ambulatory Visit
Admission: RE | Admit: 2021-08-02 | Discharge: 2021-08-02 | Disposition: A | Discharge status: Home / Self Care | Payer: BC Managed Care – PPO | Source: Ambulatory Visit

## 2021-08-02 DIAGNOSIS — Z1231 Encounter for screening mammogram for malignant neoplasm of breast: Secondary | ICD-10-CM

## 2021-08-17 ENCOUNTER — Ambulatory Visit (INDEPENDENT_AMBULATORY_CARE_PROVIDER_SITE_OTHER): Payer: BC Managed Care – PPO | Admitting: Family Medicine

## 2021-08-17 ENCOUNTER — Other Ambulatory Visit: Payer: Self-pay

## 2021-08-17 DIAGNOSIS — R7303 Prediabetes: Secondary | ICD-10-CM

## 2021-08-17 NOTE — Progress Notes (Signed)
Telehealth Encounter (Email link to [email or web address] ) PCP Abigail Dimitri, MD I connected with Abigail Byrd (MRN [id number]) on 08/17/2021 by Caregility video-enabled, HIPAA-compliant telemedicine application verified that I was speaking with the correct person using two identifiers, and that the patient was in a private environment conducive to confidentiality.  The patient agreed to proceed.  Persons participating in visit were the patient and provider (registered dietitian) Abigail Darner, PhD, RD, LDN, CEDRD.  Provider was located at [facility] during this telehealth encounter; patient was at home.  Appt start time: 1330 end time: 1430 (1 hour)  Reason for telehealth visit: Referred by PCP Abigail Dimitri, MD for Medical Nutrition Therapy related to pre-DM, obesity, and hyperlipidemia.  Relevant history/background: Abigail Byrd'Abigail A1c was 5.8 in 2021, which followed wt gain of ~15 lb and less exercise during the pandemic.  Desite having started to lose weight, A1c had increased to 6.3 by April 2022.  She has now started making very careful food choices, following Weight Watchers eating plan.  A1c in late-July 2022 was 6.1.    Assessment: Abigail Byrd had lost 6 lb, but regained some weight over Thanksgiving.  She has been checking FBG daily, which have been running 110-120 with some days below 100; lowest was 95 and highest was 125.  She has found it challenging to resist sweets that are readily available, but is reluctant to ask her husband to keep all sweets out of the house.  Abigail Byrd did not journal about regretted food choices, but has given this some thought.  Realized it is harder to make good food choices when sharing food choices with others or when out of routine.    Weight: ___ lb (did not get exact #). (Down ~28 lb in past 6-8 months.)  Ht is 69". Usual eating pattern: 3 meals and 0-2 snacks per day. Recent physical activity: 90 min doubles tennis 3-4 X wk; walks 30-60 min 7 X wk;  spin class 1 X wk and body pump class at the gym 1 X wk.   24-hr recall:  (Up at 7 AM) B (8:30 AM)-  1 apple, 1 tbsp peanut butter, water Snk ( AM)-  --- - Tennis from 9 AM to 10:30 AM; drank 32 oz water L (2:30 PM)-  1 salad w/ feta, 2tbsp, drsng, chx, 1/2 small pita, water Snk (5:30)-  1 c coffee, 2 tbsp whole milk D (7 PM)-  1 1/2 c turkey-kale-bean soup, kale salad w/ prociutto, dates, water Snk (7:30)-  1 apple, 1 tbsp peanut butter, water Typical day? Yes.   Except that lunch was later than usual, and usually eating a bit more at bkfst.   FBG this AM was 118.  Intervention: Completed diet and exercise history, answered many specific questions, and emphasized importance of resistance exercise, including for better glycemic control.    For recommendations and goals, see Patient Instructions.    Follow-up: prn.       Abigail Byrd,Abigail Byrd

## 2021-08-17 NOTE — Patient Instructions (Signed)
-   Keep in mind that the glycemic response to any food depends on the food composition as well as quantity of the food.      - Christmas foods: Consider loptions to have available that will make healthful choices easier for you.  (And review the Holidays handout that provides suggestions for helping you follow through with intended behaviors.)  Document:  - If you make a regretted food choice, what was behind that choice?  (What did you learn from this?) - Each time you do a strength workout.  (Are you meeting your goal of >2 workouts/week?)   Goals:  1. Get at least 2 strength workouts per week. 2. Limit starchy foods to TWO per meal and ONE per snack. ONE portion of a starchy food is equal to the following:             - ONE slice of bread (or its equivalent, such as half of a hamburger bun).             - 1/2 cup of a "scoopable" starchy food such as potatoes or rice.             - 15 grams of Total Carbohydrate as shown on food label.             - (The Weight Watcher's point system should also work to identify one carb portion.) 3. Set aside a few minutes once a week to determine a new recipe to try.    Follow-up as needed.

## 2021-08-18 DIAGNOSIS — Z124 Encounter for screening for malignant neoplasm of cervix: Secondary | ICD-10-CM | POA: Diagnosis not present

## 2021-08-18 DIAGNOSIS — Z01411 Encounter for gynecological examination (general) (routine) with abnormal findings: Secondary | ICD-10-CM | POA: Diagnosis not present

## 2021-08-18 DIAGNOSIS — Z6829 Body mass index (BMI) 29.0-29.9, adult: Secondary | ICD-10-CM | POA: Diagnosis not present

## 2021-08-18 DIAGNOSIS — Z01419 Encounter for gynecological examination (general) (routine) without abnormal findings: Secondary | ICD-10-CM | POA: Diagnosis not present

## 2021-08-18 DIAGNOSIS — R7303 Prediabetes: Secondary | ICD-10-CM | POA: Insufficient documentation

## 2021-10-01 DIAGNOSIS — R7303 Prediabetes: Secondary | ICD-10-CM | POA: Diagnosis not present

## 2021-10-11 DIAGNOSIS — M25552 Pain in left hip: Secondary | ICD-10-CM | POA: Diagnosis not present

## 2021-10-11 DIAGNOSIS — M79672 Pain in left foot: Secondary | ICD-10-CM | POA: Diagnosis not present

## 2021-10-18 DIAGNOSIS — H35421 Microcystoid degeneration of retina, right eye: Secondary | ICD-10-CM | POA: Diagnosis not present

## 2021-10-18 DIAGNOSIS — H2513 Age-related nuclear cataract, bilateral: Secondary | ICD-10-CM | POA: Diagnosis not present

## 2021-10-18 DIAGNOSIS — H43813 Vitreous degeneration, bilateral: Secondary | ICD-10-CM | POA: Diagnosis not present

## 2021-10-19 ENCOUNTER — Ambulatory Visit: Payer: BC Managed Care – PPO | Admitting: Podiatry

## 2021-10-20 ENCOUNTER — Ambulatory Visit (INDEPENDENT_AMBULATORY_CARE_PROVIDER_SITE_OTHER): Payer: BC Managed Care – PPO

## 2021-10-20 ENCOUNTER — Encounter: Payer: Self-pay | Admitting: Podiatry

## 2021-10-20 ENCOUNTER — Ambulatory Visit (INDEPENDENT_AMBULATORY_CARE_PROVIDER_SITE_OTHER): Payer: BC Managed Care – PPO | Admitting: Podiatry

## 2021-10-20 ENCOUNTER — Other Ambulatory Visit: Payer: Self-pay

## 2021-10-20 DIAGNOSIS — M778 Other enthesopathies, not elsewhere classified: Secondary | ICD-10-CM

## 2021-10-20 DIAGNOSIS — M205X2 Other deformities of toe(s) (acquired), left foot: Secondary | ICD-10-CM | POA: Diagnosis not present

## 2021-10-20 DIAGNOSIS — M722 Plantar fascial fibromatosis: Secondary | ICD-10-CM | POA: Diagnosis not present

## 2021-10-20 MED ORDER — TRIAMCINOLONE ACETONIDE 10 MG/ML IJ SUSP
10.0000 mg | Freq: Once | INTRAMUSCULAR | Status: AC
  Administered 2021-10-20: 10 mg

## 2021-10-20 NOTE — Progress Notes (Signed)
Subjective:   Patient ID: Abigail Byrd, female   DOB: 66 y.o.   MRN: [id number]   HPI Patient presents with discomfort of approximate 1 month duration dorsal aspect left midfoot that is painful and inflamed and also reduced range of motion around the big toe joint left with mild swelling especially lateral surface.  Patient states this is been ongoing and she does get pain in that joint periodically and does not smoke likes to be active   Review of Systems  All other systems reviewed and are negative.      Objective:  Physical Exam Vitals and nursing note reviewed.  Constitutional:      Appearance: She is well-developed.  Pulmonary:     Effort: Pulmonary effort is normal.  Musculoskeletal:        General: Normal range of motion.  Skin:    General: Skin is warm.  Neurological:     Mental Status: She is alert.    Neurovascular status found to be intact muscle strength found to be adequate range of motion adequate with patient noted to have inflammation fluid around the metatarsocuneiform joint left with swelling of the first metatarsal cuneiform right over left and enlargement of the joint.  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

## 2022-01-03 DIAGNOSIS — R7303 Prediabetes: Secondary | ICD-10-CM | POA: Diagnosis not present

## 2022-01-03 DIAGNOSIS — E785 Hyperlipidemia, unspecified: Secondary | ICD-10-CM | POA: Diagnosis not present

## 2022-01-06 DIAGNOSIS — R000 Tachycardia, unspecified: Secondary | ICD-10-CM | POA: Diagnosis not present

## 2022-01-06 DIAGNOSIS — M8580 Other specified disorders of bone density and structure, unspecified site: Secondary | ICD-10-CM | POA: Diagnosis not present

## 2022-01-06 DIAGNOSIS — R7303 Prediabetes: Secondary | ICD-10-CM | POA: Diagnosis not present

## 2022-01-06 DIAGNOSIS — M79601 Pain in right arm: Secondary | ICD-10-CM | POA: Diagnosis not present

## 2022-01-12 DIAGNOSIS — M8580 Other specified disorders of bone density and structure, unspecified site: Secondary | ICD-10-CM | POA: Diagnosis not present

## 2022-01-12 DIAGNOSIS — R7303 Prediabetes: Secondary | ICD-10-CM | POA: Diagnosis not present

## 2022-01-12 DIAGNOSIS — E785 Hyperlipidemia, unspecified: Secondary | ICD-10-CM | POA: Diagnosis not present

## 2022-01-12 DIAGNOSIS — J45909 Unspecified asthma, uncomplicated: Secondary | ICD-10-CM | POA: Diagnosis not present

## 2022-03-16 DIAGNOSIS — M25552 Pain in left hip: Secondary | ICD-10-CM | POA: Diagnosis not present

## 2022-04-11 DIAGNOSIS — L089 Local infection of the skin and subcutaneous tissue, unspecified: Secondary | ICD-10-CM | POA: Diagnosis not present

## 2022-04-11 DIAGNOSIS — T63461A Toxic effect of venom of wasps, accidental (unintentional), initial encounter: Secondary | ICD-10-CM | POA: Diagnosis not present

## 2022-04-11 DIAGNOSIS — S70361A Insect bite (nonvenomous), right thigh, initial encounter: Secondary | ICD-10-CM | POA: Diagnosis not present

## 2022-04-25 DIAGNOSIS — H2513 Age-related nuclear cataract, bilateral: Secondary | ICD-10-CM | POA: Diagnosis not present

## 2022-04-25 DIAGNOSIS — H43813 Vitreous degeneration, bilateral: Secondary | ICD-10-CM | POA: Diagnosis not present

## 2022-04-25 DIAGNOSIS — H35421 Microcystoid degeneration of retina, right eye: Secondary | ICD-10-CM | POA: Diagnosis not present

## 2022-04-27 DIAGNOSIS — D225 Melanocytic nevi of trunk: Secondary | ICD-10-CM | POA: Diagnosis not present

## 2022-04-27 DIAGNOSIS — L821 Other seborrheic keratosis: Secondary | ICD-10-CM | POA: Diagnosis not present

## 2022-04-27 DIAGNOSIS — L814 Other melanin hyperpigmentation: Secondary | ICD-10-CM | POA: Diagnosis not present

## 2022-04-27 DIAGNOSIS — L578 Other skin changes due to chronic exposure to nonionizing radiation: Secondary | ICD-10-CM | POA: Diagnosis not present

## 2022-05-12 DIAGNOSIS — M8580 Other specified disorders of bone density and structure, unspecified site: Secondary | ICD-10-CM | POA: Diagnosis not present

## 2022-05-12 DIAGNOSIS — R7303 Prediabetes: Secondary | ICD-10-CM | POA: Diagnosis not present

## 2022-05-12 DIAGNOSIS — E785 Hyperlipidemia, unspecified: Secondary | ICD-10-CM | POA: Diagnosis not present

## 2022-05-12 DIAGNOSIS — E8889 Other specified metabolic disorders: Secondary | ICD-10-CM | POA: Diagnosis not present

## 2022-05-12 DIAGNOSIS — Z1389 Encounter for screening for other disorder: Secondary | ICD-10-CM | POA: Diagnosis not present

## 2022-05-17 DIAGNOSIS — Z23 Encounter for immunization: Secondary | ICD-10-CM | POA: Diagnosis not present

## 2022-05-17 DIAGNOSIS — M79672 Pain in left foot: Secondary | ICD-10-CM | POA: Diagnosis not present

## 2022-05-17 DIAGNOSIS — Z6832 Body mass index (BMI) 32.0-32.9, adult: Secondary | ICD-10-CM | POA: Diagnosis not present

## 2022-05-17 DIAGNOSIS — Z136 Encounter for screening for cardiovascular disorders: Secondary | ICD-10-CM | POA: Diagnosis not present

## 2022-05-18 ENCOUNTER — Other Ambulatory Visit (HOSPITAL_BASED_OUTPATIENT_CLINIC_OR_DEPARTMENT_OTHER): Payer: Self-pay | Admitting: Family Medicine

## 2022-05-18 DIAGNOSIS — Z136 Encounter for screening for cardiovascular disorders: Secondary | ICD-10-CM

## 2022-06-07 DIAGNOSIS — R7303 Prediabetes: Secondary | ICD-10-CM | POA: Diagnosis not present

## 2022-06-07 DIAGNOSIS — E785 Hyperlipidemia, unspecified: Secondary | ICD-10-CM | POA: Diagnosis not present

## 2022-06-07 DIAGNOSIS — E669 Obesity, unspecified: Secondary | ICD-10-CM | POA: Diagnosis not present

## 2022-06-07 DIAGNOSIS — Z6831 Body mass index (BMI) 31.0-31.9, adult: Secondary | ICD-10-CM | POA: Diagnosis not present

## 2022-06-30 DIAGNOSIS — H5203 Hypermetropia, bilateral: Secondary | ICD-10-CM | POA: Diagnosis not present

## 2022-06-30 DIAGNOSIS — H524 Presbyopia: Secondary | ICD-10-CM | POA: Diagnosis not present

## 2022-06-30 DIAGNOSIS — H2513 Age-related nuclear cataract, bilateral: Secondary | ICD-10-CM | POA: Diagnosis not present

## 2022-07-08 ENCOUNTER — Ambulatory Visit (HOSPITAL_BASED_OUTPATIENT_CLINIC_OR_DEPARTMENT_OTHER)
Admission: RE | Admit: 2022-07-08 | Discharge: 2022-07-08 | Disposition: A | Discharge status: Home / Self Care | Payer: Self-pay | Source: Ambulatory Visit | Attending: Family Medicine | Admitting: Family Medicine

## 2022-07-08 DIAGNOSIS — Z136 Encounter for screening for cardiovascular disorders: Secondary | ICD-10-CM | POA: Insufficient documentation

## 2022-07-11 DIAGNOSIS — I2510 Atherosclerotic heart disease of native coronary artery without angina pectoris: Secondary | ICD-10-CM | POA: Diagnosis not present

## 2022-07-11 DIAGNOSIS — Z6831 Body mass index (BMI) 31.0-31.9, adult: Secondary | ICD-10-CM | POA: Diagnosis not present

## 2022-07-11 DIAGNOSIS — F419 Anxiety disorder, unspecified: Secondary | ICD-10-CM | POA: Diagnosis not present

## 2022-07-11 DIAGNOSIS — Z23 Encounter for immunization: Secondary | ICD-10-CM | POA: Diagnosis not present

## 2022-07-14 ENCOUNTER — Ambulatory Visit (HOSPITAL_COMMUNITY)
Admission: RE | Admit: 2022-07-14 | Discharge: 2022-07-14 | Disposition: A | Discharge status: Home / Self Care | Payer: PPO | Source: Ambulatory Visit | Attending: Cardiology | Admitting: Cardiology

## 2022-07-14 VITALS — BP 130/82 | HR 72 | Wt 210.5 lb

## 2022-07-14 DIAGNOSIS — Z8249 Family history of ischemic heart disease and other diseases of the circulatory system: Secondary | ICD-10-CM | POA: Insufficient documentation

## 2022-07-14 DIAGNOSIS — E782 Mixed hyperlipidemia: Secondary | ICD-10-CM | POA: Diagnosis not present

## 2022-07-14 DIAGNOSIS — I509 Heart failure, unspecified: Secondary | ICD-10-CM | POA: Insufficient documentation

## 2022-07-14 DIAGNOSIS — Z79899 Other long term (current) drug therapy: Secondary | ICD-10-CM | POA: Diagnosis not present

## 2022-07-14 DIAGNOSIS — I2510 Atherosclerotic heart disease of native coronary artery without angina pectoris: Secondary | ICD-10-CM | POA: Diagnosis not present

## 2022-07-14 DIAGNOSIS — E785 Hyperlipidemia, unspecified: Secondary | ICD-10-CM | POA: Insufficient documentation

## 2022-07-14 LAB — COMPREHENSIVE METABOLIC PANEL
ALT: 16 U/L (ref 0–44)
AST: 20 U/L (ref 15–41)
Albumin: 4.4 g/dL (ref 3.5–5.0)
Alkaline Phosphatase: 70 U/L (ref 38–126)
Anion gap: 12 (ref 5–15)
BUN: 12 mg/dL (ref 8–23)
CO2: 22 mmol/L (ref 22–32)
Calcium: 10.1 mg/dL (ref 8.9–10.3)
Chloride: 105 mmol/L (ref 98–111)
Creatinine, Ser: 0.72 mg/dL (ref 0.44–1.00)
GFR, Estimated: >60 mL/min (ref >60)
Glucose, Bld: 89 mg/dL (ref 70–99)
Potassium: 4.3 mmol/L (ref 3.5–5.1)
Sodium: 139 mmol/L (ref 135–145)
Total Bilirubin: 0.6 mg/dL (ref 0.3–1.2)
Total Protein: 7.3 g/dL (ref 6.5–8.1)

## 2022-07-14 LAB — LIPID PANEL
Cholesterol: 142 mg/dL (ref 0–200)
HDL: 64 mg/dL (ref >40)
LDL Cholesterol: 73 mg/dL (ref 0–99)
Total CHOL/HDL Ratio: 2.2 RATIO
Triglycerides: 27 mg/dL (ref <150)
VLDL: 5 mg/dL (ref 0–40)

## 2022-07-14 LAB — TSH: TSH: 1.052 u[IU]/mL (ref 0.350–4.500)

## 2022-07-14 NOTE — Patient Instructions (Signed)
There has been no changes to your medications.  Labs done today, your results will be available in MyChart, we will contact you for abnormal readings.  How to Prepare for Your Cardiac PET/CT Stress Test:  1. Please do not take these medications before your test:   Medications that may interfere with the cardiac pharmacological stress agent (ex. nitrates - including erectile dysfunction medications or beta-blockers) the day of the exam. (Erectile dysfunction medication should be held for at least 72 hrs prior to test) Theophylline containing medications for 12 hours. Dipyridamole 48 hours prior to the test. Your remaining medications may be taken with water.  2. Nothing to eat or drink, except water, 3 hours prior to arrival time.   NO caffeine/decaffeinated products, or chocolate 12 hours prior to arrival.  3. NO perfume, cologne or lotion  4. Total time is 1 to 2 hours; you may want to bring reading material for the waiting time.  5. Please report to Admitting at the [facility] Main Entrance 60 minutes early for your test.  [place], [place] [address]  Diabetic Preparation:  Hold oral medications. You may take NPH and Lantus insulin. Do not take Humalog or Humulin R (Regular Insulin) the day of your test. Check blood sugars prior to leaving the house. If able to eat breakfast prior to 3 hour fasting, you may take all medications, including your insulin, Do not worry if you miss your breakfast dose of insulin - start at your next meal.  IF YOU THINK YOU MAY BE PREGNANT, OR ARE NURSING PLEASE INFORM THE TECHNOLOGIST.  In preparation for your appointment, medication and supplies will be purchased.  Appointment availability is limited, so if you need to cancel or reschedule, please call the Radiology Department at [phone]  24 hours in advance to avoid a cancellation fee of $100.00  What to Expect After you Arrive:  Once you arrive and check in for  your appointment, you will be taken to a preparation room within the Radiology Department.  A technologist or Nurse will obtain your medical history, verify that you are correctly prepped for the exam, and explain the procedure.  Afterwards,  an IV will be started in your arm and electrodes will be placed on your skin for EKG monitoring during the stress portion of the exam. Then you will be escorted to the PET/CT scanner.  There, staff will get you positioned on the scanner and obtain a blood pressure and EKG.  During the exam, you will continue to be connected to the EKG and blood pressure machines.  A small, safe amount of a radioactive tracer will be injected in your IV to obtain a series of pictures of your heart along with an injection of a stress agent.    After your Exam:  It is recommended that you eat a meal and drink a caffeinated beverage to counter act any effects of the stress agent.  Drink plenty of fluids for the remainder of the day and urinate frequently for the first couple of hours after the exam.  Your doctor will inform you of your test results within 7-10 business days.  For questions about your test or how to prepare for your test, please call: Marchia Bond, Cardiac Imaging Nurse Navigator  Gordy Clement, Cardiac Imaging Nurse Navigator Office: [phone]   Your physician recommends that you schedule a follow-up appointment in: 6 weeks  If you have any questions or concerns before your next appointment please  send [place] a message through [place] or call our office at [phone].    TO LEAVE A MESSAGE FOR THE NURSE SELECT OPTION 2, PLEASE LEAVE A MESSAGE INCLUDING: YOUR NAME DATE OF BIRTH CALL BACK NUMBER REASON FOR CALL**this is important as we prioritize the call backs  YOU WILL RECEIVE A CALL BACK THE SAME DAY AS LONG AS YOU CALL BEFORE 4:00 PM  At the [facility], you and your health needs are our priority. As part of our continuing mission to  provide you with exceptional heart care, we have created designated Provider Care Teams. These Care Teams include your primary Cardiologist (physician) and Advanced Practice Providers (APPs- Physician Assistants and Nurse Practitioners) who all work together to provide you with the care you need, when you need it.   You may see any of the following providers on your designated Care Team at your next follow up: Dr Glori Bickers Dr Loralie Champagne Dr. Roxana Hires, NP Lyda Jester, [place] [facility] [place], [place] Forestine Na, NP Audry Riles, PharmD   Please be sure to bring in all your medications bottles to every appointment.

## 2022-07-14 NOTE — Progress Notes (Signed)
PCP: Cari Caraway, MD Cardiology: Dr. Aundra Dubin  66 y.o. with history of hyperlipidemia was self-referred for evaluation of abnormal coronary calcium score.  Patient has generally been in good health. She had a Cardiolite back in 8/11 that was normal.  She plays tennis 2-3 times/week and goes to the gym to do yoga and aerobics.  She walks for exercise and also golfs.  She has no exertional dyspnea or chest pain/tightness.  Excellent exercise tolerance.  No palpitations or lightheadedness.  She has never smoked.  Her mother had CHF of uncertain etiology, never had a definite heart attack. She had a 1st cousin on her mother's side who had MI in her 38s.  She has been on Crestor for years.  She had a calcium score in 10/23 for screening purposes, score was 1252 Agatston units placing her in the 99th percentile.  Her PCP increased her Crestor from 20 mg daily to 40 mg daily earlier this week.   ECG (personally reviewed): NSR, iRBBB  PMH: 1. Hyperlipidemia 2. Nephrolithiasis 3. CAD: Cardiolite in 8/11 was normal.  - Coronary calcium score (10/23): 1252 Agatston units, 99th percentile for age and gender.   FH: Mother died of CHF at 70, 17st cousin with MI in her 90s, father died of cancer in 67s.   ROS: All systems reviewed and negative except as per HPI.   Social History   Socioeconomic History   Marital status: Married    Spouse name: Not on file   Number of children: 3   Years of education: B.S.   Highest education level: Not on file  Occupational History   Occupation: housewife  Tobacco Use   Smoking status: Never   Smokeless tobacco: Never  Substance and Sexual Activity   Alcohol use: Yes    Alcohol/week: 5.0 - 6.0 standard drinks of alcohol    Types: 5 - 6 Standard drinks or equivalent per week    Comment: weekends only 3-4 glasses   Drug use: No   Sexual activity: Not on file  Other Topics Concern   Not on file  Social History Narrative   Patient does not drink caffeine.    Patient is right handed.   Social Determinants of Health   Financial Resource Strain: Not on file  Food Insecurity: Not on file  Transportation Needs: Not on file  Physical Activity: Not on file  Stress: Not on file  Social Connections: Not on file  Intimate Partner Violence: Not on file   Current Outpatient Medications  Medication Sig Dispense Refill   ALPRAZolam (XANAX) 0.25 MG tablet Take 0.25 mg by mouth daily as needed for anxiety.     aspirin EC 81 MG tablet Take 81 mg by mouth daily. Swallow whole.     CALCIUM PO Take 600 mg by mouth daily.     Carboxymethylcell-Hypromellose (GENTEAL OP) Apply 1 application to eye at bedtime.     cetirizine (ZYRTEC) 10 MG tablet Take 10 mg by mouth daily as needed for allergies.     cholecalciferol (VITAMIN D) 1000 units tablet Take 1,000 Units by mouth daily.     metFORMIN (GLUCOPHAGE) 500 MG tablet Take 500 mg by mouth daily with supper.     rosuvastatin (CRESTOR) 20 MG tablet Take 40 mg by mouth daily.     No current facility-administered medications for this encounter.   BP 130/82   Pulse 72   Wt 95.5 kg (210 lb 8 oz)   SpO2 99%   BMI 31.09 kg/m  General: NAD Neck: No JVD, no thyromegaly or thyroid nodule.  Lungs: Clear to auscultation bilaterally with normal respiratory effort. CV: Nondisplaced PMI.  Heart regular S1/S2, no S3/S4, no murmur.  No peripheral edema.  No carotid bruit.  Normal pedal pulses.  Abdomen: Soft, nontender, no hepatosplenomegaly, no distention.  Skin: Intact without lesions or rashes.  Neurologic: Alert and oriented x 3.  Psych: Normal affect. Extremities: No clubbing or cyanosis.  HEENT: Normal.   Assessment/Plan: 1. CAD: Calcium score with 1252 Agatston units, 99th percentile for age and gender.  Patient is a nonsmoker.  She is completely asymptomatic.  Of note, she has a maternal family history of cardiac disease, including a female first cousin with MI in her 23s. She has been on Crestor for years,  recently increased to 40 mg daily.  - Agree with ASA given high risk calcium score.  - Continue Crestor 40 mg daily.  I will check lipids today though Crestor was increased this week and I will not make changes.  Goal LDL < 55.  - I will check Lp(a).  Given family history, this may be elevated.  Would consider Repatha if significantly elevated.  - I will arrange for cardiac PET scan given very high calcium score.  If she has ischemia in multiple vascular territories that suggests disease amenable to CABG, would consider cath.  In absence of high risk PET, would avoid invasive evaluation given lack of symptoms.  2. Hyperlipidemia: See plan above.   Followup in 6 wks.   [name] 07/14/2022

## 2022-07-15 LAB — LIPOPROTEIN A (LPA): Lipoprotein (a): 191.4 nmol/L — ABNORMAL HIGH (ref <75.0)

## 2022-07-18 DIAGNOSIS — R7303 Prediabetes: Secondary | ICD-10-CM | POA: Diagnosis not present

## 2022-07-18 DIAGNOSIS — R7309 Other abnormal glucose: Secondary | ICD-10-CM | POA: Diagnosis not present

## 2022-07-21 ENCOUNTER — Telehealth (HOSPITAL_COMMUNITY): Payer: Self-pay | Admitting: *Deleted

## 2022-07-21 NOTE — Telephone Encounter (Signed)
Staff message sent to [facility] cardiac PET pool for pt to be scheduled.

## 2022-07-22 DIAGNOSIS — F419 Anxiety disorder, unspecified: Secondary | ICD-10-CM | POA: Diagnosis not present

## 2022-07-22 DIAGNOSIS — I2510 Atherosclerotic heart disease of native coronary artery without angina pectoris: Secondary | ICD-10-CM | POA: Diagnosis not present

## 2022-07-22 DIAGNOSIS — M8580 Other specified disorders of bone density and structure, unspecified site: Secondary | ICD-10-CM | POA: Diagnosis not present

## 2022-07-22 DIAGNOSIS — R7309 Other abnormal glucose: Secondary | ICD-10-CM | POA: Diagnosis not present

## 2022-07-22 DIAGNOSIS — E785 Hyperlipidemia, unspecified: Secondary | ICD-10-CM | POA: Diagnosis not present

## 2022-07-22 DIAGNOSIS — J45909 Unspecified asthma, uncomplicated: Secondary | ICD-10-CM | POA: Diagnosis not present

## 2022-07-22 DIAGNOSIS — Z23 Encounter for immunization: Secondary | ICD-10-CM | POA: Diagnosis not present

## 2022-07-22 DIAGNOSIS — E669 Obesity, unspecified: Secondary | ICD-10-CM | POA: Diagnosis not present

## 2022-07-22 DIAGNOSIS — Z6830 Body mass index (BMI) 30.0-30.9, adult: Secondary | ICD-10-CM | POA: Diagnosis not present

## 2022-07-25 ENCOUNTER — Encounter (HOSPITAL_COMMUNITY): Payer: Self-pay | Admitting: Cardiology

## 2022-07-25 DIAGNOSIS — Z6830 Body mass index (BMI) 30.0-30.9, adult: Secondary | ICD-10-CM | POA: Diagnosis not present

## 2022-07-25 DIAGNOSIS — E785 Hyperlipidemia, unspecified: Secondary | ICD-10-CM | POA: Diagnosis not present

## 2022-07-25 DIAGNOSIS — R7303 Prediabetes: Secondary | ICD-10-CM | POA: Diagnosis not present

## 2022-07-25 DIAGNOSIS — E669 Obesity, unspecified: Secondary | ICD-10-CM | POA: Diagnosis not present

## 2022-07-25 DIAGNOSIS — E782 Mixed hyperlipidemia: Secondary | ICD-10-CM

## 2022-07-25 NOTE — Progress Notes (Unsigned)
Telehealth Encounter (Email link to [email or web address] ) PCP Cari Caraway, MD I connected with Abigail Byrd (MRN [id number]) on 07/25/2022 by Caregility video-enabled, HIPAA-compliant telemedicine application verified that I was speaking with the correct person using two identifiers, and that the patient was in a private environment conducive to confidentiality.  The patient agreed to proceed.  Persons participating in visit were the patient and provider (registered dietitian) Kennith Center, PhD, RD, LDN, CEDRD.  Provider was located at [place] during this telehealth encounter; patient was at home.  Appt start time: 1330 end time: 1430 (1 hour)  Reason for telehealth visit: Referred by PCP Cari Caraway, MD for Medical Nutrition Therapy related to pre-DM, obesity, and hyperlipidemia.  Relevant history/background: [name]'s A1c was 5.8 in 2021, which followed wt gain of ~15 lb and less exercise during the pandemic.  Desite having started to lose weight, A1c had increased to 6.3 by April 2022.  She has now started making very careful food choices, following Weight Watchers eating plan.  A1c in late-July 2022 was 6.1.    Assessment: [name] had lost 6 lb, but regained some weight over Thanksgiving.  She has been checking FBG daily, which have been running 110-120 with some days below 100; lowest was 95 and highest was 125.  She has found it challenging to resist sweets that are readily available, but is reluctant to ask her husband to keep all sweets out of the house.  [name] did not journal about regretted food choices, but has given this some thought.  Realized it is harder to make good food choices when sharing food choices with others or when out of routine.    Weight: ___ lb (did not get exact #). (Down ~28 lb in past 6-8 months.)  Ht is 69". Usual eating pattern: 3 meals and 0-2 snacks per day. Recent physical activity: 90 min doubles tennis 3-4 X wk; walks 30-60 min 7 X wk;  spin class 1 X wk and body pump class at the gym 1 X wk.   24-hr recall:  (Up at 7 AM) B (8:30 AM)-  1 apple, 1 tbsp peanut butter, water Snk ( AM)-  --- - Tennis from 9 AM to 10:30 AM; drank 32 oz water L (2:30 PM)-  1 salad w/ feta, 2tbsp, drsng, chx, 1/2 small pita, water Snk (5:30)-  1 c coffee, 2 tbsp whole milk D (7 PM)-  1 1/2 c turkey-kale-bean soup, kale salad w/ prociutto, dates, water Snk (7:30)-  1 apple, 1 tbsp peanut butter, water Typical day? Yes.   Except that lunch was later than usual, and usually eating a bit more at bkfst.   FBG this AM was 118.  Intervention: Completed diet and exercise history, answered many specific questions, and emphasized importance of resistance exercise, including for better glycemic control.    For recommendations and goals, see Patient Instructions.    Follow-up: prn.       Tamica Covell,[name]

## 2022-07-26 ENCOUNTER — Encounter (HOSPITAL_COMMUNITY): Payer: Self-pay

## 2022-07-26 ENCOUNTER — Encounter: Payer: Self-pay | Admitting: Family Medicine

## 2022-07-26 ENCOUNTER — Ambulatory Visit (INDEPENDENT_AMBULATORY_CARE_PROVIDER_SITE_OTHER): Payer: PPO | Admitting: Family Medicine

## 2022-07-26 VITALS — Ht 69.0 in | Wt 209.2 lb

## 2022-07-26 DIAGNOSIS — R7303 Prediabetes: Secondary | ICD-10-CM | POA: Diagnosis not present

## 2022-07-26 NOTE — Patient Instructions (Addendum)
Discontinue supplemental calcium.  Try to get all or most of your calcium from FOOD, i.e., yogurt, dark green leafy vegetables, chia seeds, broccoli, almonds, tofu, canned salmon with bones, navy beans.  If you re-start calcium supplement, use a low-dose calcium (e.g., <250 mg) once a day.  The best supplement for is calcium citrate, which can be taken with or without food for best absorption.  You can buy this type of calcium supplement at [facility], on [address] (near [employer]).    In addition, it is probably a good idea to determine your vitamin D level, so you know if you should continue to supplement this nutrient or not.    Goals:  1. Include twice the volume of vegetables as either protein or carb foods at 14 meals per week.    2. Continue to limit carb foods to two portions per meal.   ONE portion of a starchy food is equal to the following:             - ONE slice of bread (or its equivalent, such as half of a hamburger bun).             - 1/2 cup of a "scoopable" starchy food such as potatoes or rice.             - 15 grams of Total Carbohydrate as shown on food label.     Another thing to keep in mind about carb foods is that the closer the food is to the way nature made it, the better it will be for you.  For example, just cooked whole grains of any kind are nutritionally superior to food "products" made from those grains, such as crackers, bread, ready-to-eat cereals.    3. Continue to use your MyFitnessPal to track food intake until you learn what foods comprise an ideal diet for you.  Their fat goals of <50 grams of total fat and <17 grams of saturated fat are reasonable for you.    Document progress on your goals using the Goals Sheet provided.    Call if any questions.

## 2022-07-30 ENCOUNTER — Other Ambulatory Visit: Payer: Self-pay

## 2022-07-30 ENCOUNTER — Emergency Department (HOSPITAL_COMMUNITY): Payer: PPO

## 2022-07-30 ENCOUNTER — Observation Stay (HOSPITAL_COMMUNITY)
Admission: EM | Admit: 2022-07-30 | Discharge: 2022-08-01 | Disposition: A | Discharge status: Home / Self Care | Payer: PPO | Attending: Cardiovascular Disease | Admitting: Cardiovascular Disease

## 2022-07-30 ENCOUNTER — Encounter (HOSPITAL_COMMUNITY): Payer: Self-pay

## 2022-07-30 DIAGNOSIS — Z7982 Long term (current) use of aspirin: Secondary | ICD-10-CM | POA: Diagnosis not present

## 2022-07-30 DIAGNOSIS — R079 Chest pain, unspecified: Principal | ICD-10-CM | POA: Diagnosis present

## 2022-07-30 DIAGNOSIS — R072 Precordial pain: Secondary | ICD-10-CM

## 2022-07-30 DIAGNOSIS — N200 Calculus of kidney: Secondary | ICD-10-CM | POA: Diagnosis not present

## 2022-07-30 DIAGNOSIS — I200 Unstable angina: Secondary | ICD-10-CM

## 2022-07-30 DIAGNOSIS — E7841 Elevated Lipoprotein(a): Secondary | ICD-10-CM | POA: Diagnosis not present

## 2022-07-30 DIAGNOSIS — Z79899 Other long term (current) drug therapy: Secondary | ICD-10-CM | POA: Diagnosis not present

## 2022-07-30 DIAGNOSIS — R931 Abnormal findings on diagnostic imaging of heart and coronary circulation: Secondary | ICD-10-CM

## 2022-07-30 DIAGNOSIS — E876 Hypokalemia: Secondary | ICD-10-CM | POA: Diagnosis not present

## 2022-07-30 DIAGNOSIS — E7800 Pure hypercholesterolemia, unspecified: Secondary | ICD-10-CM | POA: Insufficient documentation

## 2022-07-30 HISTORY — DX: Prediabetes: R73.03

## 2022-07-30 LAB — BASIC METABOLIC PANEL
Anion gap: 13 (ref 5–15)
BUN: 15 mg/dL (ref 8–23)
CO2: 24 mmol/L (ref 22–32)
Calcium: 10 mg/dL (ref 8.9–10.3)
Chloride: 102 mmol/L (ref 98–111)
Creatinine, Ser: 0.89 mg/dL (ref 0.44–1.00)
GFR, Estimated: >60 mL/min (ref >60)
Glucose, Bld: 98 mg/dL (ref 70–99)
Potassium: 4 mmol/L (ref 3.5–5.1)
Sodium: 139 mmol/L (ref 135–145)

## 2022-07-30 LAB — CBC
HCT: 38.5 % (ref 36.0–46.0)
Hemoglobin: 12.8 g/dL (ref 12.0–15.0)
MCH: 30.4 pg (ref 26.0–34.0)
MCHC: 33.2 g/dL (ref 30.0–36.0)
MCV: 91.4 fL (ref 80.0–100.0)
Platelets: 246 10*3/uL (ref 150–400)
RBC: 4.21 MIL/uL (ref 3.87–5.11)
RDW: 11.8 % (ref 11.5–15.5)
WBC: 6.1 10*3/uL (ref 4.0–10.5)
nRBC: 0 % (ref 0.0–0.2)

## 2022-07-30 LAB — TROPONIN I (HIGH SENSITIVITY)
Troponin I (High Sensitivity): <2 ng/L (ref <18)
Troponin I (High Sensitivity): 3 ng/L (ref <18)

## 2022-07-30 NOTE — ED Triage Notes (Signed)
Pt presents with central chest pain/burning that started about 30 mins PTA. Denies SOB/n/v. She took one SL nitro tab which she reports helped her pain. Skin warm and dry. She reports hx of anxiety and she took a xanax as well.

## 2022-07-30 NOTE — ED Provider Triage Note (Signed)
Emergency Medicine Provider Triage Evaluation Note  Abigail Byrd , a 66 y.o. female  was evaluated in triage.  Pt complains of chest pain.  Patient states chest pain began about 30 minutes prior to arrival.  Patient reports that she was at a driving range when the chest pain began.  The patient states that she took a nitroglycerin which did alleviate her chest pain.  Patient denies any shortness of breath, nausea or vomiting with this episode of chest pain.  Patient denies any history of same.  Patient reports that she was given nitro because of "high calcium".  Patient states that she has recently seen cardiology however is unable to tell me why.  Patient states the chest pain radiated to both arms.  Review of Systems  Positive:  Negative:   Physical Exam  BP 112/80 (BP Location: Right Arm)   Pulse 82   Temp 98.4 F (36.9 C)   Resp 17   Ht 5\' 9"  (1.753 m)   Wt 92.1 kg   SpO2 98%   BMI 29.98 kg/m  Gen:   Awake, no distress   Resp:  Normal effort  MSK:   Moves extremities without difficulty  Other:    Medical Decision Making  Medically screening exam initiated at 4:56 PM.  Appropriate orders placed.  DESIA SABAN was informed that the remainder of the evaluation will be completed by another provider, this initial triage assessment does not replace that evaluation, and the importance of remaining in the ED until their evaluation is complete.     Abigail Marvel, PA-C 07/30/22 1657

## 2022-07-31 ENCOUNTER — Encounter (HOSPITAL_COMMUNITY): Payer: Self-pay | Admitting: Cardiovascular Disease

## 2022-07-31 DIAGNOSIS — R079 Chest pain, unspecified: Secondary | ICD-10-CM | POA: Diagnosis present

## 2022-07-31 LAB — CBC
HCT: 38.6 % (ref 36.0–46.0)
Hemoglobin: 13 g/dL (ref 12.0–15.0)
MCH: 30.7 pg (ref 26.0–34.0)
MCHC: 33.7 g/dL (ref 30.0–36.0)
MCV: 91 fL (ref 80.0–100.0)
Platelets: 256 10*3/uL (ref 150–400)
RBC: 4.24 MIL/uL (ref 3.87–5.11)
RDW: 11.8 % (ref 11.5–15.5)
WBC: 5 10*3/uL (ref 4.0–10.5)
nRBC: 0 % (ref 0.0–0.2)

## 2022-07-31 LAB — CBG MONITORING, ED: Glucose-Capillary: 92 mg/dL (ref 70–99)

## 2022-07-31 LAB — CREATININE, SERUM
Creatinine, Ser: 0.77 mg/dL (ref 0.44–1.00)
GFR, Estimated: >60 mL/min (ref >60)

## 2022-07-31 MED ORDER — SODIUM CHLORIDE 0.9% FLUSH: 3.0000 mL | Freq: Two times a day (BID) | INTRAVENOUS | Status: DC

## 2022-07-31 MED ORDER — ROSUVASTATIN CALCIUM 20 MG PO TABS
40.0000 mg | ORAL_TABLET | Freq: Every day | ORAL | Status: DC
  Administered 2022-08-01: 40 mg via ORAL
  Filled 2022-07-31: qty 2

## 2022-07-31 MED ORDER — SODIUM CHLORIDE 0.9 % WEIGHT BASED INFUSION: 1.0000 mL/kg/h | INTRAVENOUS | Status: DC

## 2022-07-31 MED ORDER — ONDANSETRON HCL 4 MG/2ML IJ SOLN: 4.0000 mg | Freq: Four times a day (QID) | INTRAMUSCULAR | Status: DC | PRN

## 2022-07-31 MED ORDER — ALPRAZOLAM 0.25 MG PO TABS
0.2500 mg | ORAL_TABLET | Freq: Every day | ORAL | Status: DC | PRN
  Administered 2022-07-31: 0.25 mg via ORAL
  Filled 2022-07-31: qty 1

## 2022-07-31 MED ORDER — SODIUM CHLORIDE 0.9% FLUSH: 3.0000 mL | INTRAVENOUS | Status: DC | PRN

## 2022-07-31 MED ORDER — SODIUM CHLORIDE 0.9 % WEIGHT BASED INFUSION
3.0000 mL/kg/h | INTRAVENOUS | Status: DC
  Administered 2022-08-01: 3 mL/kg/h via INTRAVENOUS

## 2022-07-31 MED ORDER — ASPIRIN 81 MG PO TBEC
81.0000 mg | DELAYED_RELEASE_TABLET | Freq: Every day | ORAL | Status: DC
  Administered 2022-08-01: 81 mg via ORAL
  Filled 2022-07-31: qty 1

## 2022-07-31 MED ORDER — SODIUM CHLORIDE 0.9 % IV SOLN: 250.0000 mL | INTRAVENOUS | Status: DC | PRN

## 2022-07-31 MED ORDER — LORATADINE 10 MG PO TABS
10.0000 mg | ORAL_TABLET | Freq: Every day | ORAL | Status: DC
  Filled 2022-07-31: qty 1

## 2022-07-31 MED ORDER — SODIUM CHLORIDE 0.9% FLUSH
3.0000 mL | Freq: Two times a day (BID) | INTRAVENOUS | Status: DC
  Administered 2022-07-31: 3 mL via INTRAVENOUS

## 2022-07-31 MED ORDER — ALPRAZOLAM 0.25 MG PO TABS
0.2500 mg | ORAL_TABLET | Freq: Four times a day (QID) | ORAL | Status: DC | PRN
  Administered 2022-07-31 – 2022-08-01 (×3): 0.25 mg via ORAL
  Filled 2022-07-31 (×3): qty 1

## 2022-07-31 MED ORDER — HEPARIN SODIUM (PORCINE) 5000 UNIT/ML IJ SOLN
5000.0000 [IU] | Freq: Three times a day (TID) | INTRAMUSCULAR | Status: DC
  Administered 2022-07-31 – 2022-08-01 (×3): 5000 [IU] via SUBCUTANEOUS
  Filled 2022-07-31 (×3): qty 1

## 2022-07-31 MED ORDER — NITROGLYCERIN 0.4 MG SL SUBL: 0.4000 mg | SUBLINGUAL_TABLET | SUBLINGUAL | Status: DC | PRN

## 2022-07-31 MED ORDER — ASPIRIN 81 MG PO CHEW
81.0000 mg | CHEWABLE_TABLET | Freq: Once | ORAL | Status: AC
  Administered 2022-07-31: 81 mg via ORAL
  Filled 2022-07-31: qty 1

## 2022-07-31 MED ORDER — ACETAMINOPHEN 325 MG PO TABS: 650.0000 mg | ORAL_TABLET | ORAL | Status: DC | PRN

## 2022-07-31 NOTE — H&P (Addendum)
Cardiology Admission History and Physical   Patient ID: Abigail Byrd MRN: [id number]; DOB: [birth year]   Admission date: 07/30/2022  PCP:  Gweneth DimitriMcNeill, Wendy, MD   [facility] Providers Cardiologist:  Marca Anconaalton McLean, MD        Chief Complaint: chest pain  Patient Profile:   Abigail Byrd is a 66 y.o. female with HLD, nephrolithiasis, and elevated Ca2+ score who is being seen 07/31/2022 for the evaluation of chest pain.  History of Present Illness:   She had a Cardiolite back in 04/2010 that was normal.  She plays tennis 2-3 times/week and goes to the gym to do yoga and aerobics.  She walks for exercise and also golfs.  She has no exertional dyspnea or chest pain/tightness.  Excellent exercise tolerance.  No palpitations or lightheadedness.  She has never smoked.  Her mother had CHF of uncertain etiology, never had a definite heart attack. She had a 1st cousin on her mother's side who had MI in her 3640s.  She has been on Crestor for years. She had a calcium score in 07/11/22 for screening purposes, score was 1252 Agatston units placing her in the 99th percentile. Her PCP increased her Crestor from 20 mg daily to 40 mg daily.  She was self-referred to Dr. Shirlee LatchMcLean and seen in the office on 07/14/2022 for consultation.  She was started on aspirin and he agreed with increased dose of Crestor.  Given lack of symptoms it was decided to proceed with a cardiac PET scan and proceed with cardiac catheterization if that showed ischemia in multiple vascular territories suggesting disease amendable to CABG.   Follow-up LP(a) was 191.4.  LDL was 73.  Given elevated LP(a) it was decided to recheck lipids on 2 months of higher dose Crestor to see if they could justify PCSK9i initiation.  The cardiac PET scan has not been scheduled yet.  She has been very anxious about this.  She has been started on Lexapro as well as Xanax to help treat the anxiety.  She was in her usual state of health until 07/30/22  evening when she developed a burning soreness in her central chest that radiated down both her arms after hitting balls at the driving range.  She took both a Xanax and a sublingual nitroglycerin with relief of her symptoms.  She came straight to the emergency room where EKG showed no acute ischemic changes and delta high-sensitivity troponin have been normal.  The patient has not had any recurrent symptoms, however is extremely anxious and feels generally unwell. No SOB. No LE edema, orthopnea or PND. No dizziness or syncope. No blood in stool or urine. No palpitations.    Past Medical History:  Diagnosis Date   Allergy    Anxiety    Asthma    Colon polyp    Duodenitis    Eczema    History of kidney stones    HNP (herniated nucleus pulposus), cervical    Hypercholesteremia    Prediabetes     Past Surgical History:  Procedure Laterality Date   BALLOON DILATION N/A 11/22/2016   Procedure: BALLOON DILATION;  Surgeon: Charolett BumpersMartin K Johnson, MD;  Location: [facility];  Service: Endoscopy;  Laterality: N/A;   CESAREAN SECTION     x 3   COLONOSCOPY WITH PROPOFOL N/A 11/22/2016   Procedure: COLONOSCOPY WITH PROPOFOL;  Surgeon: Charolett BumpersMartin K Johnson, MD;  Location: [facility];  Service: Endoscopy;  Laterality: N/A;   colonscopy     x 2  ESOPHAGOGASTRODUODENOSCOPY (EGD) WITH PROPOFOL N/A 11/22/2016   Procedure: ESOPHAGOGASTRODUODENOSCOPY (EGD) WITH PROPOFOL;  Surgeon: Charolett Bumpers, MD;  Location: [facility];  Service: Endoscopy;  Laterality: N/A;   SAVORY DILATION N/A 11/22/2016   Procedure: SAVORY DILATION;  Surgeon: Charolett Bumpers, MD;  Location: [facility];  Service: Endoscopy;  Laterality: N/A;     Medications Prior to Admission: Prior to Admission medications   Medication Sig Start Date End Date Taking? Authorizing Provider  ALPRAZolam Prudy Feeler) 0.25 MG tablet Take 0.25 mg by mouth daily as needed for anxiety.    [provider]  aspirin EC 81 MG tablet Take 81 mg by mouth daily.  Swallow whole.    [provider]  Carboxymethylcell-Hypromellose (GENTEAL OP) Apply 1 application to eye at bedtime.    [provider]  cetirizine (ZYRTEC) 10 MG tablet Take 10 mg by mouth daily as needed for allergies.    [provider]  cholecalciferol (VITAMIN D) 1000 units tablet Take 1,000 Units by mouth daily.    [provider]  metFORMIN (GLUCOPHAGE) 500 MG tablet Take 1,000 mg by mouth daily with supper.    [provider]  rosuvastatin (CRESTOR) 20 MG tablet Take 40 mg by mouth daily.    [provider]     Allergies:    Allergies  Allergen Reactions   Latex    Celebrex [Celecoxib] Rash    Social History:   Social History   Socioeconomic History   Marital status: Married    Spouse name: Not on file   Number of children: 3   Years of education: B.S.   Highest education level: Not on file  Occupational History   Occupation: housewife  Tobacco Use   Smoking status: Never   Smokeless tobacco: Never  Substance and Sexual Activity   Alcohol use: Yes    Alcohol/week: 5.0 - 6.0 standard drinks of alcohol    Types: 5 - 6 Standard drinks or equivalent per week    Comment: weekends only 3-4 glasses   Drug use: No   Sexual activity: Not on file  Other Topics Concern   Not on file  Social History Narrative   Patient does not drink caffeine.   Patient is right handed.   Social Determinants of Health   Financial Resource Strain: Not on file  Food Insecurity: Not on file  Transportation Needs: Not on file  Physical Activity: Not on file  Stress: Not on file  Social Connections: Not on file  Intimate Partner Violence: Not on file    Family History:   The patient's family history includes Breast cancer in her mother and paternal aunt; Cancer in her father and mother; Heart disease in her mother.    ROS:  Please see the history of present illness.  All other ROS reviewed and negative.     Physical Exam/Data:    Vitals:   07/30/22 2355 07/31/22 0212 07/31/22 0534 07/31/22 0833  BP: 129/75 121/76 126/87 (!) 130/91  Pulse: (!) 52 (!) 54 61 75  Resp: 16 16 (!) 8 17  Temp: 97.8 F (36.6 C)  97.9 F (36.6 C)   TempSrc: Oral     SpO2: 99% 96% 98% 97%  Weight:      Height:       No intake or output data in the 24 hours ending 07/31/22 1344    07/30/2022    4:44 PM 07/26/2022    3:28 PM 07/14/2022   10:33 AM  Last 3  Weights  Weight (lbs) 203 lb 209 lb 3.2 oz 210 lb 8 oz  Weight (kg) 92.08 kg 94.892 kg 95.482 kg     Body mass index is 29.98 kg/m.  General:  Well nourished, well developed, in no acute distress HEENT: normal Neck: no JVD Vascular: No carotid bruits; Distal pulses 2+ bilaterally   Cardiac:  normal S1, S2; RRR; no murmur Lungs:  clear to auscultation bilaterally, no wheezing, rhonchi or rales  Abd: soft, nontender, no hepatomegaly  Ext: no edema Musculoskeletal:  No deformities, BUE and BLE strength normal and equal Skin: warm and dry  Neuro:  CNs 2-12 intact, no focal abnormalities noted Psych:  Normal affect    EKG:  The ECG that was done was personally reviewed and demonstrates sinus, IRBBB, HR 91  Relevant CV Studies: Cardiac CT calcium score 07/08/22 IMPRESSION: 1. Coronary calcium score of 1252. This was 99th percentile for age-, race-, and sex-matched controls.   RECOMMENDATIONS: Coronary artery calcium (CAC) score is a strong predictor of incident coronary heart disease (CHD) and provides predictive information beyond traditional risk factors. CAC scoring is reasonable to use in the decision to withhold, postpone, or initiate statin therapy in intermediate-risk or selected borderline-risk asymptomatic adults (age 52-75 years and LDL-C >=70 to <190 mg/dL) who do not have diabetes or established atherosclerotic cardiovascular disease (ASCVD).* In intermediate-risk (10-year ASCVD risk >=7.5% to <20%) adults or selected borderline-risk (10-year ASCVD risk  >=5% to <7.5%) adults in whom a CAC score is measured for the purpose of making a treatment decision the following recommendations have been made:   If CAC=0, it is reasonable to withhold statin therapy and reassess in 5 to 10 years, as long as higher risk conditions are absent (diabetes mellitus, family history of premature CHD in first degree relatives (males <55 years; females <65 years), cigarette smoking, or LDL >=190 mg/dL).   If CAC is 1 to 99, it is reasonable to initiate statin therapy for patients >=55 years of age.   If CAC is >=100 or >=75th percentile, it is reasonable to initiate statin therapy at any age.   Cardiology referral should be considered for patients with CAC scores >=400 or >=75th percentile.   *2018 AHA/ACC/AACVPR/AAPA/ABC/ACPM/ADA/AGS/APhA/ASPC/NLA/PCNA Guideline on the Management of Blood Cholesterol: A Report of the American College of Cardiology/American Heart Association Task Force on Clinical Practice Guidelines. J Am Coll Cardiol. 2019;73(24):3168-3209.   Lennie Odor, MD    Laboratory Data:  High Sensitivity Troponin:   Recent Labs  Lab 07/30/22 1653 07/30/22 2007  TROPONINIHS <2 3      Chemistry Recent Labs  Lab 07/30/22 1653  NA 139  K 4.0  CL 102  CO2 24  GLUCOSE 98  BUN 15  CREATININE 0.89  CALCIUM 10.0  GFRNONAA >60  ANIONGAP 13    No results for input(s): "PROT", "ALBUMIN", "AST", "ALT", "ALKPHOS", "BILITOT" in the last 168 hours. Lipids No results for input(s): "CHOL", "TRIG", "HDL", "LABVLDL", "LDLCALC", "CHOLHDL" in the last 168 hours. Hematology Recent Labs  Lab 07/30/22 1653  WBC 6.1  RBC 4.21  HGB 12.8  HCT 38.5  MCV 91.4  MCH 30.4  MCHC 33.2  RDW 11.8  PLT 246   Thyroid No results for input(s): "TSH", "FREET4" in the last 168 hours. BNPNo results for input(s): "BNP", "PROBNP" in the last 168 hours.  DDimer No results for input(s): "DDIMER" in the last 168 hours.   Radiology/Studies:  DG Chest  Port 1 View  Result Date: 07/30/2022 CLINICAL DATA:  Chest pain. EXAM: PORTABLE CHEST 1 VIEW COMPARISON:  None Available. FINDINGS: The heart size and mediastinal contours are within normal limits. Both lungs are clear. The visualized skeletal structures are unremarkable. IMPRESSION: No active disease. Electronically Signed   By: Larose Hires D.O.   On: 07/30/2022 17:26     Assessment and Plan:   Chest pain with elevated calcium score: ECG non ischemic and delta HS troponin negative. However; she does have a positive family history of premature CAD and an elevated coronary calcium score and LP(a). Additionally, she has had marked anxiety since this diagnosis which has complicated the situation. She now had an episode of chest pain that was relieved by SL NTG as well as xanax. Outpatient PET scan cannot be completed within the next several weeks. Will plan for admission with coronary angiography tomorrow to definitively define her anatomy. I do not think we need IV heparin at this time. Continue aspirin and statin.   I have reviewed the risks, indications, and alternatives to cardiac catheterization and possible angioplasty/stenting with the patient. Risks include but are not limited to bleeding, infection, vascular injury, stroke, myocardial infection, arrhythmia, kidney injury, radiation-related injury in the case of prolonged fluoroscopy use, emergency cardiac surgery, and death. The patient understands the risks of serious complication is low (<1%).   HLD: LDL goal <55. Crestor recently increased. Plan to recheck lipids in 2 months to assess response to therapy and possible role for PCSK9i  Risk Assessment/Risk Scores:    Severity of Illness: The appropriate patient status for this patient is OBSERVATION. Observation status is judged to be reasonable and necessary in order to provide the required intensity of service to ensure the patient's safety. The patient's presenting symptoms, physical  exam findings, and initial radiographic and laboratory data in the context of their medical condition is felt to place them at decreased risk for further clinical deterioration. Furthermore, it is anticipated that the patient will be medically stable for discharge from the hospital within 2 midnights of admission.    For questions or updates, please contact [facility] Please consult [email or web address] for contact info under     Signed, Cline Crock, PA-C  07/31/2022 1:44 PM

## 2022-07-31 NOTE — ED Provider Notes (Signed)
[facility] EMERGENCY DEPARTMENT Provider Note   CSN: [id number] Arrival date & time: 07/30/22  1621     History  Chief Complaint  Patient presents with   Chest Pain    Abigail Byrd is a 66 y.o. female.  The history is provided by the patient, medical records and the spouse. No language interpreter was used.  Chest Pain Pain location:  Substernal area Pain quality: aching and burning   Pain radiates to:  L shoulder, R shoulder, L arm and R arm Pain severity:  Mild Onset quality:  Gradual Duration:  1 day Timing:  Constant Progression:  Waxing and waning Chronicity:  New Context: at rest   Relieved by:  Nitroglycerin Worsened by:  Nothing Ineffective treatments:  None tried Associated symptoms: anxiety   Associated symptoms: no abdominal pain, no back pain, no cough, no diaphoresis, no fatigue, no fever, no headache, no heartburn, no nausea, no near-syncope, no palpitations, no shortness of breath, no syncope, no vomiting and no weakness        Home Medications Prior to Admission medications   Medication Sig Start Date End Date Taking? Authorizing Provider  ALPRAZolam Prudy Feeler) 0.25 MG tablet Take 0.25 mg by mouth daily as needed for anxiety.    [provider]  aspirin EC 81 MG tablet Take 81 mg by mouth daily. Swallow whole.    [provider]  Carboxymethylcell-Hypromellose (GENTEAL OP) Apply 1 application to eye at bedtime.    [provider]  cetirizine (ZYRTEC) 10 MG tablet Take 10 mg by mouth daily as needed for allergies.    [provider]  cholecalciferol (VITAMIN D) 1000 units tablet Take 1,000 Units by mouth daily.    [provider]  metFORMIN (GLUCOPHAGE) 500 MG tablet Take 1,000 mg by mouth daily with supper.    [provider]  rosuvastatin (CRESTOR) 20 MG tablet Take 40 mg by mouth daily.    [provider]      Allergies    Latex and Celebrex [celecoxib]    Review of  Systems   Review of Systems  Constitutional:  Negative for chills, diaphoresis, fatigue and fever.  HENT:  Negative for congestion.   Eyes:  Negative for visual disturbance.  Respiratory:  Negative for cough, chest tightness, shortness of breath and wheezing.   Cardiovascular:  Positive for chest pain. Negative for palpitations, leg swelling, syncope and near-syncope.  Gastrointestinal:  Negative for abdominal pain, constipation, diarrhea, heartburn, nausea and vomiting.  Genitourinary:  Negative for dysuria.  Musculoskeletal:  Negative for back pain and neck pain.  Neurological:  Negative for weakness, light-headedness and headaches.  Psychiatric/Behavioral:  Negative for agitation and confusion.   All other systems reviewed and are negative.   Physical Exam Updated Vital Signs BP (!) 130/91   Pulse 75   Temp 97.9 F (36.6 C)   Resp 17   Ht 5\' 9"  (1.753 m)   Wt 92.1 kg   SpO2 97%   BMI 29.98 kg/m  Physical Exam Vitals and nursing note reviewed.  Constitutional:      General: She is not in acute distress.    Appearance: She is well-developed. She is not ill-appearing, toxic-appearing or diaphoretic.  HENT:     Head: Normocephalic and atraumatic.  Eyes:     Conjunctiva/sclera: Conjunctivae normal.  Cardiovascular:     Rate and Rhythm: Normal rate and regular rhythm.     Heart sounds: Normal heart sounds. No murmur heard. Pulmonary:  Effort: Pulmonary effort is normal. No tachypnea or respiratory distress.     Breath sounds: Normal breath sounds. No decreased breath sounds, wheezing, rhonchi or rales.  Chest:     Chest wall: No tenderness.  Abdominal:     Palpations: Abdomen is soft.     Tenderness: There is no abdominal tenderness.  Musculoskeletal:        General: No swelling.     Cervical back: Neck supple.     Right lower leg: No tenderness. No edema.     Left lower leg: No tenderness. No edema.  Skin:    General: Skin is warm and dry.     Capillary Refill:  Capillary refill takes less than 2 seconds.     Coloration: Skin is not pale.  Neurological:     Mental Status: She is alert.  Psychiatric:        Mood and Affect: Mood is anxious.     ED Results / Procedures / Treatments   Labs (all labs ordered are listed, but only abnormal results are displayed) Labs Reviewed  BASIC METABOLIC PANEL  CBC  HIV ANTIBODY (ROUTINE TESTING W REFLEX)  CBC  CREATININE, SERUM  CBG MONITORING, ED  TROPONIN I (HIGH SENSITIVITY)  TROPONIN I (HIGH SENSITIVITY)    EKG EKG Interpretation  Date/Time:  Saturday July 30 2022 16:35:55 EST Ventricular Rate:  91 PR Interval:  154 QRS Duration: 88 QT Interval:  348 QTC Calculation: 428 R Axis:   48 Text Interpretation: Normal sinus rhythm Normal ECG When compared with ECG of 14-Jul-2022 10:39, PREVIOUS ECG IS PRESENT when compared to prior, similar appearnce. No STEMI Confirmed by Antony Blackbird [phone]) on 07/31/2022 10:01:27 AM  Radiology DG Chest Port 1 View  Result Date: 07/30/2022 CLINICAL DATA:  Chest pain. EXAM: PORTABLE CHEST 1 VIEW COMPARISON:  None Available. FINDINGS: The heart size and mediastinal contours are within normal limits. Both lungs are clear. The visualized skeletal structures are unremarkable. IMPRESSION: No active disease. Electronically Signed   By: Keane Police D.O.   On: 07/30/2022 17:26    Procedures Procedures    Medications Ordered in ED Medications  aspirin EC tablet 81 mg (81 mg Oral Not Given 07/31/22 1540)  rosuvastatin (CRESTOR) tablet 40 mg (40 mg Oral Not Given 07/31/22 1540)  ALPRAZolam (XANAX) tablet 0.25 mg (has no administration in time range)  loratadine (CLARITIN) tablet 10 mg (10 mg Oral Not Given 07/31/22 1553)  sodium chloride flush (NS) 0.9 % injection 3 mL (3 mLs Intravenous Not Given 07/31/22 1446)  sodium chloride flush (NS) 0.9 % injection 3 mL (has no administration in time range)  0.9 %  sodium chloride infusion (has no administration in time  range)  nitroGLYCERIN (NITROSTAT) SL tablet 0.4 mg (has no administration in time range)  acetaminophen (TYLENOL) tablet 650 mg (has no administration in time range)  ondansetron (ZOFRAN) injection 4 mg (has no administration in time range)  heparin injection 5,000 Units (has no administration in time range)  sodium chloride flush (NS) 0.9 % injection 3 mL (3 mLs Intravenous Not Given 07/31/22 1445)  aspirin chewable tablet 81 mg (81 mg Oral Given 07/31/22 1222)    ED Course/ Medical Decision Making/ A&P                           Medical Decision Making Risk OTC drugs. Decision regarding hospitalization.    KAYDIN VASSAR is a 66 y.o. female with a past  medical history significant for asthma, anxiety, hypercholesterolemia, and reportedly abnormal cardiac calcium score who presents with new chest pain and anxiety worsening.  According to patient, she recently had a calcium score test performed and she was found to be in the "99th percentile."  She also is a strong family history of heart disease.  She spoke with Dr. Marigene Ehlers with heart failure who was going to order a cardiac PET scan as she has been asymptomatic to determine if there is any concerning findings.  They report that depending on this image they may talk about doing a heart catheterization to further evaluate.  Patient reports that it has been 2-1/2 weeks and she has still not been able to have the PET scan scheduled and last night after hitting walls the driving range while getting in the car she started having soreness in her chest.  She reports it as a burning soreness in her central chest that radiated down both of her arms.  She denied other symptoms but did take both a sublingual nitroglycerin and a Xanax that helped her symptoms.  Patient was told to come to the emergency department for evaluation.  Of note, this was over 17 hours ago that she came to the emergency department.  Otherwise she denies diaphoresis, nausea,  vomiting, shortness of breath, palpitations, constipation, diarrhea, or urinary changes.  She denies new leg pain or leg swelling.  Denies history of blood clots.  She said the pain was mild but was present and is still slightly mild.  She denies any spicy foods and has never had heartburn in her life.  EKG on arrival appears similar to prior with no STEMI.  Patient had work-up starting in triage and troponin was negative x2.  Chest x-ray reassuring.  Other labs reassuring.  Due to the cardiology note saying they would consider catheterization if she had symptoms we will call cardiology to discuss a plan.  Unclear if they want to change outpatient management or feels needs to come in for further evaluation.   Cardiology will admit for heart cath.           Final Clinical Impression(s) / ED Diagnoses Final diagnoses:  Precordial pain     Clinical Impression: 1. Precordial pain     Disposition: Admit  This note was prepared with assistance of Dragon voice recognition software. Occasional wrong-word or sound-a-like substitutions may have occurred due to the inherent limitations of voice recognition software.     Abigail Byrd, Gwenyth Allegra, MD 07/31/22 1600

## 2022-07-31 NOTE — ED Notes (Signed)
Pt requesting to take home xanax 0.25mg  tablet. Asked Tegeler MD who said pt could take her home dose of xanax.

## 2022-08-01 ENCOUNTER — Encounter (HOSPITAL_COMMUNITY)
Admission: EM | Disposition: A | Discharge status: Home / Self Care | Payer: Self-pay | Source: Home / Self Care | Attending: Emergency Medicine

## 2022-08-01 ENCOUNTER — Encounter (HOSPITAL_COMMUNITY): Payer: Self-pay | Admitting: Cardiovascular Disease

## 2022-08-01 DIAGNOSIS — E785 Hyperlipidemia, unspecified: Secondary | ICD-10-CM

## 2022-08-01 DIAGNOSIS — I200 Unstable angina: Secondary | ICD-10-CM

## 2022-08-01 DIAGNOSIS — R931 Abnormal findings on diagnostic imaging of heart and coronary circulation: Secondary | ICD-10-CM

## 2022-08-01 DIAGNOSIS — E7800 Pure hypercholesterolemia, unspecified: Secondary | ICD-10-CM | POA: Diagnosis not present

## 2022-08-01 DIAGNOSIS — Z79899 Other long term (current) drug therapy: Secondary | ICD-10-CM | POA: Diagnosis not present

## 2022-08-01 DIAGNOSIS — Z7982 Long term (current) use of aspirin: Secondary | ICD-10-CM | POA: Diagnosis not present

## 2022-08-01 DIAGNOSIS — N200 Calculus of kidney: Secondary | ICD-10-CM | POA: Diagnosis not present

## 2022-08-01 DIAGNOSIS — R079 Chest pain, unspecified: Secondary | ICD-10-CM | POA: Diagnosis not present

## 2022-08-01 DIAGNOSIS — E7841 Elevated Lipoprotein(a): Secondary | ICD-10-CM | POA: Diagnosis not present

## 2022-08-01 DIAGNOSIS — E876 Hypokalemia: Secondary | ICD-10-CM | POA: Diagnosis not present

## 2022-08-01 HISTORY — PX: LEFT HEART CATH AND CORONARY ANGIOGRAPHY: CATH118249

## 2022-08-01 LAB — CBC
HCT: 36.7 % (ref 36.0–46.0)
Hemoglobin: 12.2 g/dL (ref 12.0–15.0)
MCH: 30.5 pg (ref 26.0–34.0)
MCHC: 33.2 g/dL (ref 30.0–36.0)
MCV: 91.8 fL (ref 80.0–100.0)
Platelets: 214 10*3/uL (ref 150–400)
RBC: 4 MIL/uL (ref 3.87–5.11)
RDW: 11.7 % (ref 11.5–15.5)
WBC: 5.6 10*3/uL (ref 4.0–10.5)
nRBC: 0 % (ref 0.0–0.2)

## 2022-08-01 LAB — BASIC METABOLIC PANEL
Anion gap: 9 (ref 5–15)
BUN: 9 mg/dL (ref 8–23)
CO2: 24 mmol/L (ref 22–32)
Calcium: 9.7 mg/dL (ref 8.9–10.3)
Chloride: 103 mmol/L (ref 98–111)
Creatinine, Ser: 0.85 mg/dL (ref 0.44–1.00)
GFR, Estimated: >60 mL/min (ref >60)
Glucose, Bld: 86 mg/dL (ref 70–99)
Potassium: 3.3 mmol/L — ABNORMAL LOW (ref 3.5–5.1)
Sodium: 136 mmol/L (ref 135–145)

## 2022-08-01 SURGERY — LEFT HEART CATH AND CORONARY ANGIOGRAPHY
Anesthesia: LOCAL

## 2022-08-01 MED ORDER — SODIUM CHLORIDE 0.9% FLUSH: 3.0000 mL | Freq: Two times a day (BID) | INTRAVENOUS | Status: DC

## 2022-08-01 MED ORDER — MIDAZOLAM HCL 2 MG/2ML IJ SOLN
INTRAMUSCULAR | Status: DC | PRN
  Administered 2022-08-01: 1 mg via INTRAVENOUS

## 2022-08-01 MED ORDER — SODIUM CHLORIDE 0.9 % IV SOLN: INTRAVENOUS | Status: AC

## 2022-08-01 MED ORDER — MIDAZOLAM HCL 2 MG/2ML IJ SOLN
INTRAMUSCULAR | Status: AC
  Filled 2022-08-01: qty 2

## 2022-08-01 MED ORDER — MORPHINE SULFATE (PF) 2 MG/ML IV SOLN: 2.0000 mg | INTRAVENOUS | Status: DC | PRN

## 2022-08-01 MED ORDER — FENTANYL CITRATE (PF) 100 MCG/2ML IJ SOLN
INTRAMUSCULAR | Status: DC | PRN
  Administered 2022-08-01: 25 ug via INTRAVENOUS

## 2022-08-01 MED ORDER — LIDOCAINE HCL (PF) 1 % IJ SOLN
INTRAMUSCULAR | Status: DC | PRN
  Administered 2022-08-01: 2 mL

## 2022-08-01 MED ORDER — SODIUM CHLORIDE 0.9% FLUSH: 3.0000 mL | INTRAVENOUS | Status: DC | PRN

## 2022-08-01 MED ORDER — LABETALOL HCL 5 MG/ML IV SOLN: 10.0000 mg | INTRAVENOUS | Status: DC | PRN

## 2022-08-01 MED ORDER — POTASSIUM CHLORIDE CRYS ER 20 MEQ PO TBCR
40.0000 meq | EXTENDED_RELEASE_TABLET | Freq: Once | ORAL | Status: AC
  Administered 2022-08-01: 40 meq via ORAL
  Filled 2022-08-01: qty 2

## 2022-08-01 MED ORDER — HEPARIN (PORCINE) IN NACL 1000-0.9 UT/500ML-% IV SOLN
INTRAVENOUS | Status: AC
  Filled 2022-08-01: qty 1000

## 2022-08-01 MED ORDER — VERAPAMIL HCL 2.5 MG/ML IV SOLN
INTRAVENOUS | Status: AC
  Filled 2022-08-01: qty 2

## 2022-08-01 MED ORDER — NITROGLYCERIN 1 MG/10 ML FOR IR/CATH LAB
INTRA_ARTERIAL | Status: AC
  Filled 2022-08-01: qty 10

## 2022-08-01 MED ORDER — ACETAMINOPHEN 325 MG PO TABS: 650.0000 mg | ORAL_TABLET | ORAL | Status: DC | PRN

## 2022-08-01 MED ORDER — HEPARIN SODIUM (PORCINE) 1000 UNIT/ML IJ SOLN
INTRAMUSCULAR | Status: AC
  Filled 2022-08-01: qty 10

## 2022-08-01 MED ORDER — IOHEXOL 350 MG/ML SOLN
INTRAVENOUS | Status: DC | PRN
  Administered 2022-08-01: 65 mL

## 2022-08-01 MED ORDER — LIDOCAINE HCL (PF) 1 % IJ SOLN
INTRAMUSCULAR | Status: AC
  Filled 2022-08-01: qty 30

## 2022-08-01 MED ORDER — ONDANSETRON HCL 4 MG/2ML IJ SOLN: 4.0000 mg | Freq: Four times a day (QID) | INTRAMUSCULAR | Status: DC | PRN

## 2022-08-01 MED ORDER — HYDRALAZINE HCL 20 MG/ML IJ SOLN: 10.0000 mg | INTRAMUSCULAR | Status: DC | PRN

## 2022-08-01 MED ORDER — SODIUM CHLORIDE 0.9 % IV SOLN: 250.0000 mL | INTRAVENOUS | Status: DC | PRN

## 2022-08-01 MED ORDER — FENTANYL CITRATE (PF) 100 MCG/2ML IJ SOLN
INTRAMUSCULAR | Status: AC
  Filled 2022-08-01: qty 2

## 2022-08-01 MED ORDER — HEPARIN SODIUM (PORCINE) 1000 UNIT/ML IJ SOLN
INTRAMUSCULAR | Status: DC | PRN
  Administered 2022-08-01: 4500 [IU] via INTRAVENOUS

## 2022-08-01 MED ORDER — HEPARIN (PORCINE) IN NACL 1000-0.9 UT/500ML-% IV SOLN
INTRAVENOUS | Status: DC | PRN
  Administered 2022-08-01 (×2): 500 mL

## 2022-08-01 MED ORDER — VERAPAMIL HCL 2.5 MG/ML IV SOLN
INTRA_ARTERIAL | Status: DC | PRN
  Administered 2022-08-01: 5 mL via INTRA_ARTERIAL

## 2022-08-01 SURGICAL SUPPLY — 11 items
CATH INFINITI JR4 5F (CATHETERS) IMPLANT
CATH OPTITORQUE TIG 4.0 5F (CATHETERS) IMPLANT
DEVICE RAD COMP TR BAND LRG (VASCULAR PRODUCTS) IMPLANT
GLIDESHEATH SLEND A-KIT 6F 22G (SHEATH) IMPLANT
GUIDEWIRE INQWIRE 1.5J.035X260 (WIRE) IMPLANT
INQWIRE 1.5J .035X260CM (WIRE) ×1
KIT HEART LEFT (KITS) ×1 IMPLANT
PACK CARDIAC CATHETERIZATION (CUSTOM PROCEDURE TRAY) ×1 IMPLANT
TRANSDUCER W/STOPCOCK (MISCELLANEOUS) ×1 IMPLANT
TUBING CIL FLEX 10 FLL-RA (TUBING) ×1 IMPLANT
WIRE HI TORQ VERSACORE-J 145CM (WIRE) IMPLANT

## 2022-08-01 NOTE — Progress Notes (Signed)
Pt experienced and reported a brief period of wavy like blur at the top corner of her right eye. Pt assessed, and reported symptoms of described event above was subsiding. Yoko, Consulting civil engineer notified, and additional neuro assessment was done on patient. Pt exhibits  no deficits and pt reports wavy-like blur gone. Cardiology team notified. Patient's plan of care ongoing.

## 2022-08-01 NOTE — TOC Initial Note (Signed)
Transition of Care [facility]) - Initial/Assessment Note    Patient Details  Name: Abigail Byrd MRN: [id number] Date of Birth: [birth year]  Transition of Care [facility]) CM/SW Contact:    Abigail Cousin, RN Phone Number: [phone] 08/01/2022, 4:31 PM  Clinical Narrative:                 HF TOC CM spoke to pt and husband at bedside. Pt reports having scale at home for daily weight. Patient was independent prior to admission.   Expected Discharge Plan: Home/Self Care Barriers to Discharge: No Barriers Identified   Patient Goals and CMS Choice        Expected Discharge Plan and Services Expected Discharge Plan: Home/Self Care   Discharge Planning Services: CM Consult   Living arrangements for the past 2 months: Single Family Home Expected Discharge Date: 08/01/22                                    Prior Living Arrangements/Services Living arrangements for the past 2 months: Single Family Home Lives with:: Spouse Patient language and need for interpreter reviewed:: Yes Do you feel safe going back to the place where you live?: Yes      Need for Family Participation in Patient Care: No (Comment) Care giver support system in place?: Yes (comment)   Criminal Activity/Legal Involvement Pertinent to Current Situation/Hospitalization: No - Comment as needed  Activities of Daily Living Home Assistive Devices/Equipment: None ADL Screening (condition at time of admission) Patient's cognitive ability adequate to safely complete daily activities?: Yes Is the patient deaf or have difficulty hearing?: No Does the patient have difficulty seeing, even when wearing glasses/contacts?: No Does the patient have difficulty concentrating, remembering, or making decisions?: No Patient able to express need for assistance with ADLs?: Yes Does the patient have difficulty dressing or bathing?: No Independently performs ADLs?: Yes (appropriate for developmental age) Does the patient have  difficulty walking or climbing stairs?: No Weakness of Legs: None Weakness of Arms/Hands: None  Permission Sought/Granted Permission sought to share information with : Case Manager, Family Supports, PCP Permission granted to share information with : Yes, Verbal Permission Granted  Share Information with NAME: Abigail Byrd     Permission granted to share info w Relationship: husband  Permission granted to share info w Contact Information: [phone]  Emotional Assessment Appearance:: Appears stated age Attitude/Demeanor/Rapport: Engaged Affect (typically observed): Accepting Orientation: : Oriented to Self, Oriented to Place, Oriented to  Time, Oriented to Situation   Psych Involvement: No (comment)  Admission diagnosis:  Precordial pain [R07.2] Chest pain [R07.9] Patient Active Problem List   Diagnosis Date Noted   Elevated coronary artery calcium score 08/01/2022   Unstable angina (HCC) 08/01/2022   Chest pain 07/31/2022   CHF (congestive heart failure) (HCC) 07/14/2022   Tremor 05/05/2015   Hyperlipidemia 12/22/2013   BMI 31.0-31.9,adult 12/22/2013   PCP:  Gweneth Dimitri, MD Pharmacy:   [id number] - [place], [place] - [address] AT [facility] [address] [place] [address] Phone: [phone] Fax: [phone]  [id number] - [place], [place] - [address] AT [facility] [address] [place] [address] Phone: [phone] Fax: [phone]     Social Determinants of Health (SDOH) Interventions    Readmission Risk Interventions     No data to display

## 2022-08-01 NOTE — Discharge Summary (Addendum)
Advanced Heart Failure Team  Discharge Summary   Patient ID: Abigail Byrd MRN: [id number], DOB/AGE: [birth year] 66 y.o. Admit date: 07/30/2022 D/C date:     08/01/2022   Primary Discharge Diagnoses:  Chest pain Hypokalemia Hyperlipidemia  Hospital Course:  Abigail Byrd is a 66 y.o. female with HLD, nephrolithiasis, and elevated Ca2+ score who presented to the ED with chest pain.  Calcium score in 07/11/22 was 1252 Agatston units placing her in the 99th percentile. PCP made changes to her statin. Was later seen in Dr. Alford Highland office 07/14/22, statin increased and plans were made for a cardiac PET scan and cardiac cath.   Since the office visit she she has been very anxious about upcoming tests and was started on xanax and lexapro. 07/30/22 she developed burning soreness in her mid-chest that radiated down both arms after incident [place] driving range. Took a xanax and nitro with symptom relief. In the ED EKG and HsTrops were negative. Proceeded with cath which showed: heavy coronary calcification primarily in the LAD and circumflex, no obstructive disease. Essentially has normal coronaries and normal LV function   Stable to discharge. Chest pain resolved. Pt will continue to be followed closely in the HF clinic. Dr Shirlee Latch evaluated and deemed appropriate for discharge. F/u scheduled. Patient with no questions.   Referral to lipid clinic made.   See below for detailed problem list: Chest pain HS Trop negative. EKG without acute ischemic changes Had cath that showed heavy coronary calcification primarily in the LAD and circumflex, no obstructive disease Continue ASA and Crestor 2. Hypokalemia K Replaced  3. Hyperlipidemia  Elevated lipoprotein at 191 LDL 73, goal <55 Continue Crestor Referral to lipid clinic made.  Discharge Weight Range: 92.9kg Discharge Vitals: Blood pressure 110/75, pulse 60, temperature 98.4 F (36.9 C), temperature source Oral, resp. rate 18, height 5\' 9"  (1.753  m), weight 92.9 kg, SpO2 96 %.  Labs: Lab Results  Component Value Date   WBC 5.6 08/01/2022   HGB 12.2 08/01/2022   HCT 36.7 08/01/2022   MCV 91.8 08/01/2022   PLT 214 08/01/2022    Recent Labs  Lab 08/01/22 0145  NA 136  K 3.3*  CL 103  CO2 24  BUN 9  CREATININE 0.85  CALCIUM 9.7  GLUCOSE 86   Lab Results  Component Value Date   CHOL 142 07/14/2022   HDL 64 07/14/2022   LDLCALC 73 07/14/2022   TRIG 27 07/14/2022   BNP (last 3 results) No results for input(s): "BNP" in the last 8760 hours.  ProBNP (last 3 results) No results for input(s): "PROBNP" in the last 8760 hours.   Diagnostic Studies/Procedures   CARDIAC CATHETERIZATION  Result Date: 08/01/2022 Images from the original result were not included.   The left ventricular systolic function is normal.   LV end diastolic pressure is normal.   The left ventricular ejection fraction is 55-65% by visual estimate. Abigail Byrd is a 66 y.o. female  78 LOCATION:  FACILITY: [facility] PHYSICIAN: [id number], M.D. 1956-01-26 DATE OF PROCEDURE:  08/01/2022 DATE OF DISCHARGE: CARDIAC CATHETERIZATION History obtained from chart review.66 y.o. female with history of hyperlipidemia and nephrolithiasis who recently established care with Dr. 79 after finding of elevated calcium scoring (pending cardiac PET scan) who presented for evaluation of chest pain.  Her troponins were negative.  She presents now for radial diagnostic coronary angiography.   Abigail Byrd has heavy coronary calcification primarily in the LAD and circumflex although she has no obstructive disease.  She essentially has normal coronaries and normal LV function.  I believe her chest pain is noncardiac.  The sheath was removed and a TR band was placed on the right wrist to achieve patent hemostasis.  The patient left lab in stable condition. Nanetta Batty. MD, [facility] 08/01/2022 11:16 AM    DG Chest Port 1 View  Result Date: 07/30/2022 CLINICAL DATA:  Chest  pain. EXAM: PORTABLE CHEST 1 VIEW COMPARISON:  None Available. FINDINGS: The heart size and mediastinal contours are within normal limits. Both lungs are clear. The visualized skeletal structures are unremarkable. IMPRESSION: No active disease. Electronically Signed   By: Larose Hires D.O.   On: 07/30/2022 17:26    Discharge Medications   Allergies as of 08/01/2022       Reactions   Celebrex [celecoxib] Rash   Latex Rash        Medication List     TAKE these medications    ALPRAZolam 0.25 MG tablet Commonly known as: XANAX Take 0.25 mg by mouth daily as needed for anxiety.   aspirin EC 81 MG tablet Take 81 mg by mouth daily. Swallow whole.   cetirizine 10 MG tablet Commonly known as: ZYRTEC Take 10 mg by mouth daily as needed for allergies.   cholecalciferol 1000 units tablet Commonly known as: VITAMIN D Take 1,000 Units by mouth daily.   escitalopram 10 MG tablet Commonly known as: LEXAPRO Take 5 mg by mouth daily. May increase to 10 mg after 7 days.   GENTEAL OP Place 1 drop into both eyes at bedtime.   metFORMIN 500 MG 24 hr tablet Commonly known as: GLUCOPHAGE-XR Take 1,000 mg by mouth every evening.   nitroGLYCERIN 0.4 MG SL tablet Commonly known as: NITROSTAT Place 0.4 mg under the tongue every 5 (five) minutes as needed for chest pain.   Rosuvastatin Calcium 40 MG Cpsp Take 40 mg by mouth daily.        Disposition   The patient will be discharged in stable condition to home. Discharge Instructions     (HEART FAILURE PATIENTS) Call MD:  Anytime you have any of the following symptoms: 1) 3 pound weight gain in 24 hours or 5 pounds in 1 week 2) shortness of breath, with or without a dry hacking cough 3) swelling in the hands, feet or stomach 4) if you have to sleep on extra pillows at night in order to breathe.   Complete by: As directed    Diet - low sodium heart healthy   Complete by: As directed    Increase activity slowly   Complete by: As  directed        Follow-up Information     [facility] Follow up on 08/31/2022.   Specialty: Cardiology Why: Follow up in the Advanced Heart Failure Clinie 08/30/22 at 1030am Please bring all medications with you Entrance C, free valet Contact information: [address] [id number] mc [place] [address] [phone]                  Duration of Discharge Encounter: Greater than 35 minutes   Signed, Alen Bleacher AGACNP-BC  08/01/2022, 12:29 PM

## 2022-08-01 NOTE — H&P (View-Only) (Signed)
Rounding Note    Patient Name: DELBRA ZELLARS Date of Encounter: 08/01/2022  [facility] Cardiologist: [name], MD   Subjective   Feeling well. No chest pain, sob or palpitations.    Inpatient Medications    Scheduled Meds:  aspirin EC  81 mg Oral Daily   heparin  5,000 Units Subcutaneous Q8H   loratadine  10 mg Oral Daily   potassium chloride  40 mEq Oral Once   rosuvastatin  40 mg Oral Daily   sodium chloride flush  3 mL Intravenous Q12H   sodium chloride flush  3 mL Intravenous Q12H   Continuous Infusions:  sodium chloride     sodium chloride     sodium chloride 1 mL/kg/hr (08/01/22 0525)   PRN Meds: sodium chloride, sodium chloride, acetaminophen, ALPRAZolam, nitroGLYCERIN, ondansetron (ZOFRAN) IV, sodium chloride flush, sodium chloride flush   Vital Signs    Vitals:   07/31/22 1808 07/31/22 2033 08/01/22 0110 08/01/22 0419  BP: 127/89 111/74 106/77 107/71  Pulse: 73 65 65 60  Resp:  18 18 18   Temp:  98.2 F (36.8 C) (!) 96.7 F (35.9 C) 98.3 F (36.8 C)  TempSrc:  Oral Oral Oral  SpO2: 97% 93% 96% 95%  Weight:      Height:        Intake/Output Summary (Last 24 hours) at 08/01/2022 0718 Last data filed at 08/01/2022 0525 Gross per 24 hour  Intake 259.43 ml  Output --  Net 259.43 ml      07/31/2022    6:07 PM 07/30/2022    4:44 PM 07/26/2022    3:28 PM  Last 3 Weights  Weight (lbs) 204 lb 14.4 oz 203 lb 209 lb 3.2 oz  Weight (kg) 92.942 kg 92.08 kg 94.892 kg      Telemetry    NSR - Personally Reviewed  ECG    N/A  Physical Exam   GEN: No acute distress.   Neck: No JVD Cardiac: RRR, no murmurs, rubs, or gallops.  Respiratory: Clear to auscultation bilaterally. GI: Soft, nontender, non-distended  MS: No edema; No deformity. Neuro:  Nonfocal  Psych: Normal affect   Labs    High Sensitivity Troponin:   Recent Labs  Lab 07/30/22 1653 07/30/22 2007  TROPONINIHS <2 3     Chemistry Recent Labs  Lab  07/30/22 1653 07/31/22 1633 08/01/22 0145  NA 139  --  136  K 4.0  --  3.3*  CL 102  --  103  CO2 24  --  24  GLUCOSE 98  --  86  BUN 15  --  9  CREATININE 0.89 0.77 0.85  CALCIUM 10.0  --  9.7  GFRNONAA >60 >60 >60  ANIONGAP 13  --  9    Hematology Recent Labs  Lab 07/30/22 1653 07/31/22 1633 08/01/22 0145  WBC 6.1 5.0 5.6  RBC 4.21 4.24 4.00  HGB 12.8 13.0 12.2  HCT 38.5 38.6 36.7  MCV 91.4 91.0 91.8  MCH 30.4 30.7 30.5  MCHC 33.2 33.7 33.2  RDW 11.8 11.8 11.7  PLT 246 256 214    Radiology    DG Chest Port 1 View  Result Date: 07/30/2022 CLINICAL DATA:  Chest pain. EXAM: PORTABLE CHEST 1 VIEW COMPARISON:  None Available. FINDINGS: The heart size and mediastinal contours are within normal limits. Both lungs are clear. The visualized skeletal structures are unremarkable. IMPRESSION: No active disease. Electronically Signed   By: 13/07/2022.O.  On: 07/30/2022 17:26    Cardiac Studies   Pending cath   Patient Profile     66 y.o. female with history of hyperlipidemia and nephrolithiasis who recently established care with Dr. Shirlee Latch after finding of elevated calcium scoring (pending cardiac PET scan) who presented for evaluation of chest pain.  Pending cardiac catheterization.  Assessment & Plan    Chest pain Nitro and Xanax responsive.  Felt symptoms could be due to anxiety/stress versus angina.  Due to long waiting time of PET scan, cardiac catheterization recommended for further evaluation. Troponin negative EKG without acute ischemic changes Currently chest pain-free. Continue aspirin and Crestor The patient understands that risks include but are not limited to stroke (1 in 1000), death (1 in 1000), kidney failure [usually temporary] (1 in 500), bleeding (1 in 200), allergic reaction [possibly serious] (1 in 200), and agrees to proceed.     Hypokalemia Will supplement  Hyperlipidemia Elevated lipoprotein at 191 LDL 73 Continue Crestor   For  questions or updates, please contact [facility] Please consult [email or web address] for contact info under        SignedManson Passey, PA  08/01/2022, 7:18 AM     Patient seen with PA, agree with the above note.   No further chest pain.  Quite anxious about procedure.   General: NAD Neck: No JVD, no thyromegaly or thyroid nodule.  Lungs: Clear to auscultation bilaterally with normal respiratory effort. CV: Nondisplaced PMI.  Heart regular S1/S2, no S3/S4, no murmur.  No peripheral edema.   Abdomen: Soft, nontender, no hepatosplenomegaly, no distention.  Skin: Intact without lesions or rashes.  Neurologic: Alert and oriented x 3.  Psych: Normal affect. Extremities: No clubbing or cyanosis.  HEENT: Normal.   1. CAD: Recent calcium score with 1252 Agatston units, 99th percentile for age and gender.  Patient is a nonsmoker. Of note, she has a maternal family history of cardiac disease, including a female first cousin with MI in her 43s. She has been on Crestor for years, recently increased to 40 mg daily. Lipoprotein(a) high at 191.  She had been totally asymptomatic (very active, tennis player) until yesterday when she developed a 10 minute episode of chest pain at rest relieved by Xanax and NTG. She came to the ER, ECG and HS-TnI unremarkable. No further CP overnight. Of note, cardiac PET scan had been planned to assess for evidence of high risk disease but had been scheduled several months out.  - Given degree of anxiety as well as risk factors, I think we should proceed with definitive testing => cath today. I discussed risk/benefits of procedure and she agrees.  - Continue ASA 81 .  - Continue Crestor 40 mg daily.  Would like to get into lipid clinic to see if we can get her Repatha given elevated Lp(a).  2. Hyperlipidemia: See plan above.   [name] 08/01/2022 7:48 AM

## 2022-08-01 NOTE — Interval H&P Note (Signed)
Cath Lab Visit (complete for each Cath Lab visit)  Clinical Evaluation Leading to the Procedure:   ACS: No.  Non-ACS:    Anginal Classification: CCS II  Anti-ischemic medical therapy: No Therapy  Non-Invasive Test Results: No non-invasive testing performed  Prior CABG: No previous CABG      History and Physical Interval Note:  08/01/2022 10:35 AM  Abigail Byrd  has presented today for surgery, with the diagnosis of unstable angina.  The various methods of treatment have been discussed with the patient and family. After consideration of risks, benefits and other options for treatment, the patient has consented to  Procedure(s): LEFT HEART CATH AND CORONARY ANGIOGRAPHY (N/A) as a surgical intervention.  The patient's history has been reviewed, patient examined, no change in status, stable for surgery.  I have reviewed the patient's chart and labs.  Questions were answered to the patient's satisfaction.     Nanetta Batty

## 2022-08-01 NOTE — Progress Notes (Addendum)
Rounding Note    Patient Name: DELBRA ZELLARS Date of Encounter: 08/01/2022  [facility] Cardiologist: [name], MD   Subjective   Feeling well. No chest pain, sob or palpitations.    Inpatient Medications    Scheduled Meds:  aspirin EC  81 mg Oral Daily   heparin  5,000 Units Subcutaneous Q8H   loratadine  10 mg Oral Daily   potassium chloride  40 mEq Oral Once   rosuvastatin  40 mg Oral Daily   sodium chloride flush  3 mL Intravenous Q12H   sodium chloride flush  3 mL Intravenous Q12H   Continuous Infusions:  sodium chloride     sodium chloride     sodium chloride 1 mL/kg/hr (08/01/22 0525)   PRN Meds: sodium chloride, sodium chloride, acetaminophen, ALPRAZolam, nitroGLYCERIN, ondansetron (ZOFRAN) IV, sodium chloride flush, sodium chloride flush   Vital Signs    Vitals:   07/31/22 1808 07/31/22 2033 08/01/22 0110 08/01/22 0419  BP: 127/89 111/74 106/77 107/71  Pulse: 73 65 65 60  Resp:  18 18 18   Temp:  98.2 F (36.8 C) (!) 96.7 F (35.9 C) 98.3 F (36.8 C)  TempSrc:  Oral Oral Oral  SpO2: 97% 93% 96% 95%  Weight:      Height:        Intake/Output Summary (Last 24 hours) at 08/01/2022 0718 Last data filed at 08/01/2022 0525 Gross per 24 hour  Intake 259.43 ml  Output --  Net 259.43 ml      07/31/2022    6:07 PM 07/30/2022    4:44 PM 07/26/2022    3:28 PM  Last 3 Weights  Weight (lbs) 204 lb 14.4 oz 203 lb 209 lb 3.2 oz  Weight (kg) 92.942 kg 92.08 kg 94.892 kg      Telemetry    NSR - Personally Reviewed  ECG    N/A  Physical Exam   GEN: No acute distress.   Neck: No JVD Cardiac: RRR, no murmurs, rubs, or gallops.  Respiratory: Clear to auscultation bilaterally. GI: Soft, nontender, non-distended  MS: No edema; No deformity. Neuro:  Nonfocal  Psych: Normal affect   Labs    High Sensitivity Troponin:   Recent Labs  Lab 07/30/22 1653 07/30/22 2007  TROPONINIHS <2 3     Chemistry Recent Labs  Lab  07/30/22 1653 07/31/22 1633 08/01/22 0145  NA 139  --  136  K 4.0  --  3.3*  CL 102  --  103  CO2 24  --  24  GLUCOSE 98  --  86  BUN 15  --  9  CREATININE 0.89 0.77 0.85  CALCIUM 10.0  --  9.7  GFRNONAA >60 >60 >60  ANIONGAP 13  --  9    Hematology Recent Labs  Lab 07/30/22 1653 07/31/22 1633 08/01/22 0145  WBC 6.1 5.0 5.6  RBC 4.21 4.24 4.00  HGB 12.8 13.0 12.2  HCT 38.5 38.6 36.7  MCV 91.4 91.0 91.8  MCH 30.4 30.7 30.5  MCHC 33.2 33.7 33.2  RDW 11.8 11.8 11.7  PLT 246 256 214    Radiology    DG Chest Port 1 View  Result Date: 07/30/2022 CLINICAL DATA:  Chest pain. EXAM: PORTABLE CHEST 1 VIEW COMPARISON:  None Available. FINDINGS: The heart size and mediastinal contours are within normal limits. Both lungs are clear. The visualized skeletal structures are unremarkable. IMPRESSION: No active disease. Electronically Signed   By: 13/07/2022.O.  On: 07/30/2022 17:26    Cardiac Studies   Pending cath   Patient Profile     66 y.o. female with history of hyperlipidemia and nephrolithiasis who recently established care with Dr. Shirlee Latch after finding of elevated calcium scoring (pending cardiac PET scan) who presented for evaluation of chest pain.  Pending cardiac catheterization.  Assessment & Plan    Chest pain Nitro and Xanax responsive.  Felt symptoms could be due to anxiety/stress versus angina.  Due to long waiting time of PET scan, cardiac catheterization recommended for further evaluation. Troponin negative EKG without acute ischemic changes Currently chest pain-free. Continue aspirin and Crestor The patient understands that risks include but are not limited to stroke (1 in 1000), death (1 in 1000), kidney failure [usually temporary] (1 in 500), bleeding (1 in 200), allergic reaction [possibly serious] (1 in 200), and agrees to proceed.     Hypokalemia Will supplement  Hyperlipidemia Elevated lipoprotein at 191 LDL 73 Continue Crestor   For  questions or updates, please contact [facility] Please consult [email or web address] for contact info under        SignedManson Passey, PA  08/01/2022, 7:18 AM     Patient seen with PA, agree with the above note.   No further chest pain.  Quite anxious about procedure.   General: NAD Neck: No JVD, no thyromegaly or thyroid nodule.  Lungs: Clear to auscultation bilaterally with normal respiratory effort. CV: Nondisplaced PMI.  Heart regular S1/S2, no S3/S4, no murmur.  No peripheral edema.   Abdomen: Soft, nontender, no hepatosplenomegaly, no distention.  Skin: Intact without lesions or rashes.  Neurologic: Alert and oriented x 3.  Psych: Normal affect. Extremities: No clubbing or cyanosis.  HEENT: Normal.   1. CAD: Recent calcium score with 1252 Agatston units, 99th percentile for age and gender.  Patient is a nonsmoker. Of note, she has a maternal family history of cardiac disease, including a female first cousin with MI in her 8s. She has been on Crestor for years, recently increased to 40 mg daily. Lipoprotein(a) high at 191.  She had been totally asymptomatic (very active, tennis player) until yesterday when she developed a 10 minute episode of chest pain at rest relieved by Xanax and NTG. She came to the ER, ECG and HS-TnI unremarkable. No further CP overnight. Of note, cardiac PET scan had been planned to assess for evidence of high risk disease but had been scheduled several months out.  - Given degree of anxiety as well as risk factors, I think we should proceed with definitive testing => cath today. I discussed risk/benefits of procedure and she agrees.  - Continue ASA 81 .  - Continue Crestor 40 mg daily.  Would like to get into lipid clinic to see if we can get her Repatha given elevated Lp(a).  2. Hyperlipidemia: See plan above.   [name] 08/01/2022 7:48 AM

## 2022-08-01 NOTE — Care Management Obs Status (Signed)
MEDICARE OBSERVATION STATUS NOTIFICATION   Patient Details  Name: BRIGETT ESTELL MRN: [id number] Date of Birth: Nov 29, 1955   Medicare Observation Status Notification Given:  Yes    Elliot Cousin, RN 08/01/2022, 3:26 PM

## 2022-08-02 DIAGNOSIS — F419 Anxiety disorder, unspecified: Secondary | ICD-10-CM | POA: Diagnosis not present

## 2022-08-02 DIAGNOSIS — I2510 Atherosclerotic heart disease of native coronary artery without angina pectoris: Secondary | ICD-10-CM | POA: Diagnosis not present

## 2022-08-03 LAB — MISC LABCORP TEST (SEND OUT): Labcorp test code: 83935

## 2022-08-05 NOTE — Telephone Encounter (Signed)
Pt aware of results-high Lp(A), crestor increased and may add repatha in the future   Patient has multiple questions regarding how cath results correspond with elevated Lp(A).  Advised since she has an upcoming appt questions are best discussed at this time however patient has high healthy anxiety and would like a returned call regarding results

## 2022-08-05 NOTE — Telephone Encounter (Signed)
Per Dr Shirlee Latch -she does not need PET scan with recent cath (will cancel order or PA work up) -will need referral to lipid clinic for repatha (order placed)

## 2022-08-07 ENCOUNTER — Encounter (HOSPITAL_COMMUNITY): Payer: Self-pay | Admitting: Cardiology

## 2022-08-08 DIAGNOSIS — F419 Anxiety disorder, unspecified: Secondary | ICD-10-CM | POA: Diagnosis not present

## 2022-08-08 DIAGNOSIS — R001 Bradycardia, unspecified: Secondary | ICD-10-CM | POA: Diagnosis not present

## 2022-08-08 DIAGNOSIS — Z6830 Body mass index (BMI) 30.0-30.9, adult: Secondary | ICD-10-CM | POA: Diagnosis not present

## 2022-08-29 DIAGNOSIS — J189 Pneumonia, unspecified organism: Secondary | ICD-10-CM | POA: Diagnosis not present

## 2022-08-29 DIAGNOSIS — U071 COVID-19: Secondary | ICD-10-CM | POA: Diagnosis not present

## 2022-08-31 ENCOUNTER — Ambulatory Visit (HOSPITAL_COMMUNITY)
Admission: RE | Admit: 2022-08-31 | Discharge: 2022-08-31 | Disposition: A | Discharge status: Home / Self Care | Payer: PPO | Source: Ambulatory Visit | Attending: Cardiology | Admitting: Cardiology

## 2022-08-31 ENCOUNTER — Encounter (HOSPITAL_COMMUNITY): Payer: Self-pay | Admitting: Cardiology

## 2022-08-31 VITALS — Wt 197.0 lb

## 2022-08-31 DIAGNOSIS — I2510 Atherosclerotic heart disease of native coronary artery without angina pectoris: Secondary | ICD-10-CM | POA: Diagnosis not present

## 2022-08-31 DIAGNOSIS — E782 Mixed hyperlipidemia: Secondary | ICD-10-CM

## 2022-08-31 NOTE — Progress Notes (Signed)
Heart Failure TeleHealth Note  Due to national recommendations of social distancing due to COVID 19, Audio/video telehealth visit is felt to be most appropriate for this patient at this time.  See MyChart message from today for patient consent regarding telehealth for Abigail Byrd.  Date:  08/31/2022   ID:  Abigail Byrd, DOB November 03, 1955, MRN [id number]  Location: Home  Provider location: [facility] Type of Visit: Established patient   PCP:  Gweneth Dimitri, MD  Cardiologist:  [name], MD   History of Present Illness: Abigail Byrd is a 65 y.o. female with a history of CAD.  She presents via Web designer for a telehealth visit today.     66 y.o. with history of hyperlipidemia was self-referred for evaluation of abnormal coronary calcium score.  Patient has generally been in good health. She had a Cardiolite back in 8/11 that was normal.  She plays tennis 2-3 times/week and goes to the gym to do yoga and aerobics.  She walks for exercise and also golfs.  She has no exertional dyspnea or chest pain/tightness.  Excellent exercise tolerance.  No palpitations or lightheadedness.  She has never smoked.  Her mother had CHF of uncertain etiology, never had a definite heart attack. She had a 1st cousin on her mother's side who had MI in her 71s.  She has been on Crestor for years.  She had a calcium score in 10/23 for screening purposes, score was 1252 Agatston units placing her in the 99th percentile.  Her PCP increased her Crestor from 20 mg daily to 40 mg daily.   She was set up for cardiac PET, but she had an episode of atypical chest pain and ended up in the hospital in 11/23.  She had a cath showing nonobstructive plaque, EF 55-60%.    Since that time, she has been doing well.  No further chest pain.  No exertional dyspnea.  She exercises recently, plays tennis regularly.  No exercise limitation.  No palpitations.   Labs (10/23): LDL 73, Lp(a)  191  PMH: 1. Hyperlipidemia 2. Nephrolithiasis 3. CAD: Cardiolite in 8/11 was normal.  - Coronary calcium score (10/23): 1252 Agatston units, 99th percentile for age and gender.  - LHC (11/23): EF 55-60%, nonobstructive mild CAD.   FH: Mother died of CHF at 33, 1st cousin with MI in her 31s, father died of cancer in 24s.   ROS: All systems reviewed and negative except as per HPI.   Social History   Socioeconomic History   Marital status: Married    Spouse name: Not on file   Number of children: 3   Years of education: B.S.   Highest education level: Not on file  Occupational History   Occupation: housewife  Tobacco Use   Smoking status: Never   Smokeless tobacco: Never  Substance and Sexual Activity   Alcohol use: Yes    Alcohol/week: 5.0 - 6.0 standard drinks of alcohol    Types: 5 - 6 Standard drinks or equivalent per week    Comment: weekends only 3-4 glasses   Drug use: No   Sexual activity: Not on file  Other Topics Concern   Not on file  Social History Narrative   Patient does not drink caffeine.   Patient is right handed.   Social Determinants of Health   Financial Resource Strain: Not on file  Food Insecurity: No Food Insecurity (07/31/2022)   Hunger Vital Sign    Worried About Running Out  of Food in the Last Year: Never true    Ran Out of Food in the Last Year: Never true  Transportation Needs: No Transportation Needs (07/31/2022)   PRAPARE - Administrator, Civil Service (Medical): No    Lack of Transportation (Non-Medical): No  Physical Activity: Not on file  Stress: Not on file  Social Connections: Not on file  Intimate Partner Violence: Not At Risk (07/31/2022)   Humiliation, Afraid, Rape, and Kick questionnaire    Fear of Current or Ex-Partner: No    Emotionally Abused: No    Physically Abused: No    Sexually Abused: No   Current Outpatient Medications  Medication Sig Dispense Refill   ALPRAZolam (XANAX) 0.25 MG tablet Take 0.25  mg by mouth daily as needed for anxiety.     aspirin EC 81 MG tablet Take 81 mg by mouth daily. Swallow whole.     Carboxymethylcell-Hypromellose (GENTEAL OP) Place 1 drop into both eyes at bedtime.     cetirizine (ZYRTEC) 10 MG tablet Take 10 mg by mouth daily as needed for allergies.     cholecalciferol (VITAMIN D) 1000 units tablet Take 1,000 Units by mouth daily.     escitalopram (LEXAPRO) 10 MG tablet Take 5 mg by mouth daily. May increase to 10 mg after 7 days.     metFORMIN (GLUCOPHAGE-XR) 500 MG 24 hr tablet Take 1,000 mg by mouth every evening.     nitroGLYCERIN (NITROSTAT) 0.4 MG SL tablet Place 0.4 mg under the tongue every 5 (five) minutes as needed for chest pain.     Rosuvastatin Calcium 40 MG CPSP Take 40 mg by mouth daily.     No current facility-administered medications for this encounter.   Wt 89.4 kg (197 lb)   BMI 29.09 kg/m  Exam:  (Video/Tele Health Call; Exam is subjective and or/visual.) General:  Speaks in full sentences. No resp difficulty. Lungs: Normal respiratory effort with conversation.  Abdomen: Non-distended per patient report Extremities: Pt denies edema. Neuro: Alert & oriented x 3.   Assessment/Plan: 1. CAD: Calcium score with 1252 Agatston units, 99th percentile for age and gender. Elevated Lp(a) also suggests elevated risk.  Patient is a nonsmoker.  Of note, she has a maternal family history of cardiac disease, including a female first cousin with MI in her 68s. She has been on Crestor for years, recently increased to 40 mg daily.  She had atypical chest pain with cath in 11/23 showing nonobstructive disease. No further chest pain.  - Continue ASA given high risk calcium score and Lp(a).  - Continue Crestor 40 mg daily.  I will arrange for lipids/LFTs, goal LDL < 55.   2. Hyperlipidemia: She is currently on Crestor 40.  She has a significantly elevated Lp(a) level which suggests higher risk for coronary events.  - Continue Crestor, check lipid/LFTs as  above.  - With elevated Lp(a), I would like to see her on Repatha.  I will send to lipid clinic for evaluation for Repatha (could lower Crestor dose to make room for Repatha use).   COVID screen The patient does not have any symptoms that suggest any further testing/ screening at this time.  Social distancing reinforced today.  Patient Risk: After full review of this patients clinical status, I feel that they are at moderate risk for cardiac decompensation at this time.  Relevant cardiac medications were reviewed at length with the patient today. The patient does not have concerns regarding their medications at this time.  Recommended follow-up:  6 months  Today, I have spent 16 minutes with the patient with telehealth technology discussing the above issues .    Signed, [name], MD  08/31/2022   [facility] [address] [facility] [place] [address] [phone] (office) [phone] (fax)

## 2022-08-31 NOTE — Patient Instructions (Signed)
No changes to your medications.   Blood work in 3 weeks  Your physician recommends that you schedule a follow-up appointment in: 6 months ( June 2024)  ** please call the office in March to arrange your follow up appointment **  If you have any questions or concerns before your next appointment please send [place] a message through [place] or call our office at [phone].    TO LEAVE A MESSAGE FOR THE NURSE SELECT OPTION 2, PLEASE LEAVE A MESSAGE INCLUDING: YOUR NAME DATE OF BIRTH CALL BACK NUMBER REASON FOR CALL**this is important as we prioritize the call backs  YOU WILL RECEIVE A CALL BACK THE SAME DAY AS LONG AS YOU CALL BEFORE 4:00 PM  At the [facility], you and your health needs are our priority. As part of our continuing mission to provide you with exceptional heart care, we have created designated Provider Care Teams. These Care Teams include your primary Cardiologist (physician) and Advanced Practice Providers (APPs- Physician Assistants and Nurse Practitioners) who all work together to provide you with the care you need, when you need it.   You may see any of the following providers on your designated Care Team at your next follow up: Dr Arvilla Meres Dr [name] Dr. Marcos Eke, NP Robbie Lis, [place] [facility] [place], [place] Brynda Peon, NP Karle Plumber, PharmD   Please be sure to bring in all your medications bottles to every appointment.

## 2022-09-06 ENCOUNTER — Other Ambulatory Visit: Payer: Self-pay | Admitting: Family Medicine

## 2022-09-06 DIAGNOSIS — Z1231 Encounter for screening mammogram for malignant neoplasm of breast: Secondary | ICD-10-CM

## 2022-09-21 ENCOUNTER — Ambulatory Visit (HOSPITAL_COMMUNITY)
Admission: RE | Admit: 2022-09-21 | Discharge: 2022-09-21 | Disposition: A | Discharge status: Home / Self Care | Payer: PPO | Source: Ambulatory Visit | Attending: Cardiology | Admitting: Cardiology

## 2022-09-21 DIAGNOSIS — E782 Mixed hyperlipidemia: Secondary | ICD-10-CM

## 2022-09-21 LAB — HEPATIC FUNCTION PANEL
ALT: 20 U/L (ref 0–44)
AST: 24 U/L (ref 15–41)
Albumin: 4.1 g/dL (ref 3.5–5.0)
Alkaline Phosphatase: 70 U/L (ref 38–126)
Bilirubin, Direct: <0.1 mg/dL (ref 0.0–0.2)
Total Bilirubin: 0.6 mg/dL (ref 0.3–1.2)
Total Protein: 7.2 g/dL (ref 6.5–8.1)

## 2022-09-21 LAB — LIPID PANEL
Cholesterol: 156 mg/dL (ref 0–200)
HDL: 63 mg/dL (ref >40)
LDL Cholesterol: 83 mg/dL (ref 0–99)
Total CHOL/HDL Ratio: 2.5 RATIO
Triglycerides: 50 mg/dL (ref <150)
VLDL: 10 mg/dL (ref 0–40)

## 2022-09-22 DIAGNOSIS — Z6829 Body mass index (BMI) 29.0-29.9, adult: Secondary | ICD-10-CM | POA: Diagnosis not present

## 2022-09-22 DIAGNOSIS — R7303 Prediabetes: Secondary | ICD-10-CM | POA: Diagnosis not present

## 2022-09-22 DIAGNOSIS — E785 Hyperlipidemia, unspecified: Secondary | ICD-10-CM | POA: Diagnosis not present

## 2022-09-22 DIAGNOSIS — E663 Overweight: Secondary | ICD-10-CM | POA: Diagnosis not present

## 2022-09-28 ENCOUNTER — Encounter (HOSPITAL_COMMUNITY): Payer: Self-pay | Admitting: Cardiology

## 2022-09-30 DIAGNOSIS — R079 Chest pain, unspecified: Secondary | ICD-10-CM | POA: Diagnosis not present

## 2022-10-04 ENCOUNTER — Encounter: Payer: Self-pay | Admitting: Pharmacist

## 2022-10-04 ENCOUNTER — Telehealth: Payer: Self-pay | Admitting: Pharmacist

## 2022-10-04 ENCOUNTER — Ambulatory Visit: Payer: PPO | Attending: Cardiology | Admitting: Pharmacist

## 2022-10-04 DIAGNOSIS — I200 Unstable angina: Secondary | ICD-10-CM

## 2022-10-04 DIAGNOSIS — E782 Mixed hyperlipidemia: Secondary | ICD-10-CM

## 2022-10-04 DIAGNOSIS — R931 Abnormal findings on diagnostic imaging of heart and coronary circulation: Secondary | ICD-10-CM | POA: Diagnosis not present

## 2022-10-04 NOTE — Telephone Encounter (Signed)
PA for Repatha submitted.  Key:  [id number]

## 2022-10-04 NOTE — Patient Instructions (Addendum)
It was nice meeting you two today  We would like your LDL (bad cholesterol) to be less than 55  Please continue your rosuvastatin 40mg  daily  I will complete the prior authorization for Repatha for you and contact you when it is approved  Once you start the medication we will recheck your cholesterol in 2-3 months  Please call or message with any questions  Karren Cobble, PharmD, [place], [place], [place], [place], [place], [address] Phone: [phone], Fax: [phone]

## 2022-10-04 NOTE — Progress Notes (Signed)
Patient ID: Abigail Byrd                 DOB: [birth year]                    MRN: [id number]     HPI: Abigail Byrd is a 67 y.o. female patient referred to lipid clinic by Dr Aundra Dubin. PMH is significant for CAD, elevated coronary calcium score, angina, HLD, and elevated LP(a).  Patient presents today with husband Abigail Byrd. Very nervous and anxious regarding her cardiac health since she received her results from coronary CT.  Cardiac Cath performed in November 2023 showed calcified arteries but no obstructive disease. Has continued to exercise by walking and playing tennis and is focusing on heart healthy foods. Currently treated with rosuvastatin 40mg  daily.   Patient self referred herself to Dr Aundra Dubin about 10 years ago. Wonders if she needs a primary cardiologist. Has many questions regarding her risk factors and chances of future events.  Reports she has a latex allergy.  Current Medications:  Rosuvastatin 40mg  daily  Intolerances:  N/A  Risk Factors:  Elevated LP(a) HLD Coronary Calcium  LDL goal: <55  Labs:TC 156, Trigs 50, HDL 63, LDL 83, LPA 191.4  Imaging:  Coronary calcium score of 1252. This was 99th percentile for age-, race-, and sex-matched controls.  Past Medical History:  Diagnosis Date   Allergy    Anxiety    Asthma    Colon polyp    Duodenitis    Eczema    History of kidney stones    HNP (herniated nucleus pulposus), cervical    Hypercholesteremia    Prediabetes     Current Outpatient Medications on File Prior to Visit  Medication Sig Dispense Refill   augmented betamethasone dipropionate (DIPROLENE-AF) 0.05 % ointment 1 application Externally twice a day for 14 days     budesonide-formoterol (SYMBICORT) 160-4.5 MCG/ACT inhaler 2 puffs Inhalation Twice a day as needed     fluocinonide cream (LIDEX) 0.05 % as directed Externally not for face or skin folds once daily for 2 weeks     ALPRAZolam (XANAX) 0.25 MG tablet Take 0.25 mg by mouth daily as needed  for anxiety.     aspirin EC 81 MG tablet Take 81 mg by mouth daily. Swallow whole.     calcium carbonate (CALCIUM 600) 600 MG TABS tablet 1 tablet Orally once a day     Carboxymethylcell-Hypromellose (GENTEAL OP) Place 1 drop into both eyes at bedtime.     cetirizine (ZYRTEC) 10 MG tablet Take 10 mg by mouth daily as needed for allergies.     cholecalciferol (VITAMIN D) 1000 units tablet Take 1,000 Units by mouth daily.     diphenhydrAMINE (BENADRYL ALLERGY) 25 mg capsule Take 1 capsule as needed by oral route.     escitalopram (LEXAPRO) 10 MG tablet Take 5 mg by mouth daily. May increase to 10 mg after 7 days.     metFORMIN (GLUCOPHAGE-XR) 500 MG 24 hr tablet Take 1,000 mg by mouth every evening.     nitroGLYCERIN (NITROSTAT) 0.4 MG SL tablet Place 0.4 mg under the tongue every 5 (five) minutes as needed for chest pain.     Rosuvastatin Calcium 40 MG CPSP Take 40 mg by mouth daily.     tretinoin (RETIN-A) 0.05 % cream APPLY EXTERNALLY TO THE AFFECTED AREA OF THE FACE IN THE EVENING ONCE DAILY for 20     No current facility-administered medications on file prior to  visit.    Allergies  Allergen Reactions   Celebrex [Celecoxib] Rash   Latex Rash    Assessment/Plan:  1. Hyperlipidemia - Patient most recent LDL 83 on rosuvastatin 40mg  which is above goal of <55. Aggressive goal chosen due to significantly elevated coronary calcium score and Lpa.  Patient is motivated and worried regarding her cardiac health.  Discussed patients concern regarding her Lpa and coronary calcium score. Explained how cath in November was reassuring however it would be beneficial for her to reduce LDL. Explained mechanism of action of Repatha and its role in reducing LDL and plaque stabilization. Patient voiced understanding. Explained storage, site selection, and possible adverse effects. Patient has listed latex allergy but does not think Repatha will bother her. Will complete PA and contact patient when approved.  Recheck lipid panel in 2-3 months.  Continue rosuvastatin 40mg  daily Start Repatha 140mg  q 2 weeks Recheck lipid panel in 2-3 months  Karren Cobble, PharmD, [place], [place], [place], [place], [place], [address] Phone: [phone], Fax: [phone]

## 2022-10-05 ENCOUNTER — Telehealth: Payer: Self-pay | Admitting: Cardiology

## 2022-10-05 MED ORDER — REPATHA SURECLICK 140 MG/ML ~~LOC~~ SOAJ: 1.0000 mL | SUBCUTANEOUS | 3 refills | Status: DC

## 2022-10-05 NOTE — Telephone Encounter (Signed)
Patient requested to just see Dr. Johney Frame for general cardiology and stop seeing CHF clinic.

## 2022-10-05 NOTE — Addendum Note (Signed)
Addended by: Rollen Sox on: 10/05/2022 08:21 AM   Modules accepted: Orders

## 2022-10-05 NOTE — Telephone Encounter (Signed)
PA approved through 03/06/23

## 2022-10-12 DIAGNOSIS — Z6829 Body mass index (BMI) 29.0-29.9, adult: Secondary | ICD-10-CM | POA: Diagnosis not present

## 2022-10-12 DIAGNOSIS — M8580 Other specified disorders of bone density and structure, unspecified site: Secondary | ICD-10-CM | POA: Diagnosis not present

## 2022-10-12 DIAGNOSIS — E8889 Other specified metabolic disorders: Secondary | ICD-10-CM | POA: Diagnosis not present

## 2022-10-12 DIAGNOSIS — I2510 Atherosclerotic heart disease of native coronary artery without angina pectoris: Secondary | ICD-10-CM | POA: Diagnosis not present

## 2022-10-12 DIAGNOSIS — J45909 Unspecified asthma, uncomplicated: Secondary | ICD-10-CM | POA: Diagnosis not present

## 2022-10-12 DIAGNOSIS — F419 Anxiety disorder, unspecified: Secondary | ICD-10-CM | POA: Diagnosis not present

## 2022-10-12 DIAGNOSIS — R7303 Prediabetes: Secondary | ICD-10-CM | POA: Diagnosis not present

## 2022-10-12 DIAGNOSIS — E785 Hyperlipidemia, unspecified: Secondary | ICD-10-CM | POA: Diagnosis not present

## 2022-10-21 ENCOUNTER — Other Ambulatory Visit: Payer: Self-pay | Admitting: Pharmacist

## 2022-10-21 DIAGNOSIS — I200 Unstable angina: Secondary | ICD-10-CM

## 2022-10-21 DIAGNOSIS — E782 Mixed hyperlipidemia: Secondary | ICD-10-CM

## 2022-10-21 DIAGNOSIS — R931 Abnormal findings on diagnostic imaging of heart and coronary circulation: Secondary | ICD-10-CM

## 2022-10-24 DIAGNOSIS — E785 Hyperlipidemia, unspecified: Secondary | ICD-10-CM | POA: Diagnosis not present

## 2022-10-24 DIAGNOSIS — Z6829 Body mass index (BMI) 29.0-29.9, adult: Secondary | ICD-10-CM | POA: Diagnosis not present

## 2022-10-24 DIAGNOSIS — F419 Anxiety disorder, unspecified: Secondary | ICD-10-CM | POA: Diagnosis not present

## 2022-10-24 DIAGNOSIS — R7303 Prediabetes: Secondary | ICD-10-CM | POA: Diagnosis not present

## 2022-10-24 DIAGNOSIS — E663 Overweight: Secondary | ICD-10-CM | POA: Diagnosis not present

## 2022-10-28 DIAGNOSIS — I200 Unstable angina: Secondary | ICD-10-CM | POA: Diagnosis not present

## 2022-10-28 DIAGNOSIS — R931 Abnormal findings on diagnostic imaging of heart and coronary circulation: Secondary | ICD-10-CM | POA: Diagnosis not present

## 2022-10-28 DIAGNOSIS — E782 Mixed hyperlipidemia: Secondary | ICD-10-CM | POA: Diagnosis not present

## 2022-10-29 LAB — LIPID PANEL
Chol/HDL Ratio: 1.6 ratio (ref 0.0–4.4)
Cholesterol, Total: 105 mg/dL (ref 100–199)
HDL: 66 mg/dL (ref >39)
LDL Chol Calc (NIH): 25 mg/dL (ref 0–99)
Triglycerides: 67 mg/dL (ref 0–149)
VLDL Cholesterol Cal: 14 mg/dL (ref 5–40)

## 2022-10-29 NOTE — Progress Notes (Signed)
Cardiology Office Note:    Date:  11/02/2022   ID:  Abigail Byrd, DOB [birth year], MRN [id number]  PCP:  Gweneth Dimitri, MD   [facility] Providers Cardiologist:  [name], MD   Referring MD: Gweneth Dimitri, MD    History of Present Illness:    Abigail Byrd is a 67 y.o. female with a hx of CAD with heavy Ca in LAD and Lcx but no obstructive CAD on cath in 07/2022, HLD and family history of CAD who was referred by Dr. Corliss Blacker for further management of CAD.  Patient previously seen by Dr. Jearld Pies on 08/2022. She had self referred due to Ca score 1252 (99%). She was active at that time. She was planned for PET but developed atypical chest pain and therefore was hospitalized and underwent cath on 08/01/22 which revealed nonobstructive disease with EF 55-60%. LDL 73, Lp (a) 191. She was maintained on ASA, repatha and crestor.  Today, the patient overall feels well today. No chest pain, SOB, LE edema, orthopnea or PND. Has been tolerating medications as prescribed. She is working on dietary changes and weight loss to help reduce CV risk. She is very active and exercises 6 days per week.   Blood pressure has been well controlled.   Past Medical History:  Diagnosis Date   Allergy    Anxiety    Asthma    Colon polyp    Duodenitis    Eczema    History of kidney stones    HNP (herniated nucleus pulposus), cervical    Hypercholesteremia    Prediabetes     Past Surgical History:  Procedure Laterality Date   BALLOON DILATION N/A 11/22/2016   Procedure: BALLOON DILATION;  Surgeon: Charolett Bumpers, MD;  Location: [facility];  Service: Endoscopy;  Laterality: N/A;   CESAREAN SECTION     x 3   COLONOSCOPY WITH PROPOFOL N/A 11/22/2016   Procedure: COLONOSCOPY WITH PROPOFOL;  Surgeon: Charolett Bumpers, MD;  Location: [facility];  Service: Endoscopy;  Laterality: N/A;   colonscopy     x 2   ESOPHAGOGASTRODUODENOSCOPY (EGD) WITH PROPOFOL N/A 11/22/2016   Procedure:  ESOPHAGOGASTRODUODENOSCOPY (EGD) WITH PROPOFOL;  Surgeon: Charolett Bumpers, MD;  Location: [facility];  Service: Endoscopy;  Laterality: N/A;   LEFT HEART CATH AND CORONARY ANGIOGRAPHY N/A 08/01/2022   Procedure: LEFT HEART CATH AND CORONARY ANGIOGRAPHY;  Surgeon: Runell Gess, MD;  Location: [facility];  Service: Cardiovascular;  Laterality: N/A;   SAVORY DILATION N/A 11/22/2016   Procedure: SAVORY DILATION;  Surgeon: Charolett Bumpers, MD;  Location: [facility];  Service: Endoscopy;  Laterality: N/A;    Current Medications: Current Meds  Medication Sig   ALPRAZolam (XANAX) 0.25 MG tablet Take 0.25 mg by mouth daily as needed for anxiety.   aspirin EC 81 MG tablet Take 81 mg by mouth daily. Swallow whole.   augmented betamethasone dipropionate (DIPROLENE-AF) 0.05 % ointment 1 application Externally twice a day for 14 days   budesonide-formoterol (SYMBICORT) 160-4.5 MCG/ACT inhaler 2 puffs Inhalation Twice a day as needed   calcium carbonate (CALCIUM 600) 600 MG TABS tablet 1 tablet Orally once a day   Carboxymethylcell-Hypromellose (GENTEAL OP) Place 1 drop into both eyes at bedtime.   cetirizine (ZYRTEC) 10 MG tablet Take 10 mg by mouth daily as needed for allergies.   cholecalciferol (VITAMIN D) 1000 units tablet Take 1,000 Units by mouth daily.   diphenhydrAMINE (BENADRYL ALLERGY) 25 mg capsule Take 1  capsule as needed by oral route.   escitalopram (LEXAPRO) 10 MG tablet Take 5 mg by mouth daily. May increase to 10 mg after 7 days.   Evolocumab (REPATHA SURECLICK) 140 MG/ML SOAJ Inject 140 mg into the skin every 14 (fourteen) days.   fluocinonide cream (LIDEX) 0.05 % as directed Externally not for face or skin folds once daily for 2 weeks   metFORMIN (GLUCOPHAGE-XR) 500 MG 24 hr tablet Take 1,000 mg by mouth every evening.   nitroGLYCERIN (NITROSTAT) 0.4 MG SL tablet Place 0.4 mg under the tongue every 5 (five) minutes as needed for chest pain.   Rosuvastatin Calcium 40 MG  CPSP Take 40 mg by mouth daily.   tretinoin (RETIN-A) 0.05 % cream APPLY EXTERNALLY TO THE AFFECTED AREA OF THE FACE IN THE EVENING ONCE DAILY for 20     Allergies:   Celebrex [celecoxib] and Latex   Social History   Socioeconomic History   Marital status: Married    Spouse name: Not on file   Number of children: 3   Years of education: B.S.   Highest education level: Not on file  Occupational History   Occupation: housewife  Tobacco Use   Smoking status: Never   Smokeless tobacco: Never  Substance and Sexual Activity   Alcohol use: Yes    Alcohol/week: 5.0 - 6.0 standard drinks of alcohol    Types: 5 - 6 Standard drinks or equivalent per week    Comment: weekends only 3-4 glasses   Drug use: No   Sexual activity: Not on file  Other Topics Concern   Not on file  Social History Narrative   Patient does not drink caffeine.   Patient is right handed.   Social Determinants of Health   Financial Resource Strain: Not on file  Food Insecurity: No Food Insecurity (07/31/2022)   Hunger Vital Sign    Worried About Running Out of Food in the Last Year: Never true    Ran Out of Food in the Last Year: Never true  Transportation Needs: No Transportation Needs (07/31/2022)   PRAPARE - Administrator, Civil Service (Medical): No    Lack of Transportation (Non-Medical): No  Physical Activity: Not on file  Stress: Not on file  Social Connections: Not on file     Family History: The patient's family history includes Breast cancer in her mother and paternal aunt; Cancer in her father and mother; Heart attack in her cousin; Heart disease in her mother.  ROS:   Please see the history of present illness.     All other systems reviewed and are negative.  EKGs/Labs/Other Studies Reviewed:    The following studies were reviewed today: [facility] 2022-08-28:   The left ventricular systolic function is normal.   LV end diastolic pressure is normal.   The left ventricular ejection  fraction is 55-65% by visual estimate.  EKG:  EKG is not ordered today.   Recent Labs: 07/14/2022: TSH 1.052 08/01/2022: BUN 9; Creatinine, Ser 0.85; Hemoglobin 12.2; Platelets 214; Potassium 3.3; Sodium 136 09/21/2022: ALT 20  Recent Lipid Panel    Component Value Date/Time   CHOL 105 10/28/2022 1118   TRIG 67 10/28/2022 1118   HDL 66 10/28/2022 1118   CHOLHDL 1.6 10/28/2022 1118   CHOLHDL 2.5 09/21/2022 0842   VLDL 10 09/21/2022 0842   LDLCALC 25 10/28/2022 1118     Risk Assessment/Calculations:  Physical Exam:    VS:  BP 129/77   Pulse 74   Ht 5\' 9"  (1.753 m)   Wt 198 lb 3.2 oz (89.9 kg)   SpO2 96%   BMI 29.27 kg/m     Wt Readings from Last 3 Encounters:  11/02/22 198 lb 3.2 oz (89.9 kg)  08/31/22 197 lb (89.4 kg)  07/31/22 204 lb 14.4 oz (92.9 kg)     GEN:  Well nourished, well developed in no acute distress HEENT: Normal NECK: No JVD; No carotid bruits CARDIAC: RRR, no murmurs, rubs, gallops RESPIRATORY:  Clear to auscultation without rales, wheezing or rhonchi  ABDOMEN: Soft, non-tender, non-distended MUSCULOSKELETAL:  No edema; No deformity  SKIN: Warm and dry NEUROLOGIC:  Alert and oriented x 3 PSYCHIATRIC:  Normal affect   ASSESSMENT:    1. Coronary artery disease involving native coronary artery of native heart without angina pectoris   2. Mixed hyperlipidemia    PLAN:    In order of problems listed above:  #Nonobstructive CAD: Ca score 1252 with nonobstructive CAD on cath in 07/2022 that was performed due to atypical chest pain. Currently, doing well without anginal symptoms.  -Continue ASA 81mg  daily -Continue crestor 40mg  daily and repatha 140mg  q 2weeks -Continue lifestyle modifications  #HLD with Elevated Lp(a): -Continue crestor 40mg  daily and repatha 140mg  q 2weeks -Continue lifestyle modifications  Exercise recommendations: Goal of exercising for at least 30 minutes a day, at least 5 times per week.  Please  exercise to a moderate exertion.  This means that while exercising it is difficult to speak in full sentences, however you are not so short of breath that you feel you must stop, and not so comfortable that you can carry on a full conversation.  Exertion level should be approximately a 5/10, if 10 is the most exertion you can perform.  Diet recommendations: Recommend a heart healthy diet such as the Mediterranean diet.  This diet consists of plant based foods, healthy fats, lean meats, olive oil.  It suggests limiting the intake of simple carbohydrates such as white breads, pastries, and pastas.  It also limits the amount of red meat, wine, and dairy products such as cheese that one should consume on a daily basis.       Medication Adjustments/Labs and Tests Ordered: Current medicines are reviewed at length with the patient today.  Concerns regarding medicines are outlined above.  No orders of the defined types were placed in this encounter.  No orders of the defined types were placed in this encounter.   Patient Instructions  Medication Instructions:   Your physician recommends that you continue on your current medications as directed. Please refer to the Current Medication list given to you today.  *If you need a refill on your cardiac medications before your next appointment, please call your pharmacy*    Follow-Up: At [facility], you and your health needs are our priority.  As part of our continuing mission to provide you with exceptional heart care, we have created designated Provider Care Teams.  These Care Teams include your primary Cardiologist (physician) and Advanced Practice Providers (APPs -  Physician Assistants and Nurse Practitioners) who all work together to provide you with the care you need, when you need it.  We recommend signing up for the patient portal called "MyChart".  Sign up information is provided on this After Visit Summary.  MyChart is used to connect  with patients for Virtual Visits (Telemedicine).  Patients are able  to view lab/test results, encounter notes, upcoming appointments, etc.  Non-urgent messages can be sent to your provider as well.   To learn more about what you can do with MyChart, go to [email or web address].    Your next appointment:   6 month(s)  Provider:   Dr. Shari Prows     Signed, Meriam Sprague, MD  11/02/2022 4:24 PM    [facility]

## 2022-10-31 ENCOUNTER — Ambulatory Visit
Admission: RE | Admit: 2022-10-31 | Discharge: 2022-10-31 | Disposition: A | Discharge status: Home / Self Care | Payer: PPO | Source: Ambulatory Visit | Attending: Family Medicine | Admitting: Family Medicine

## 2022-10-31 DIAGNOSIS — Z1231 Encounter for screening mammogram for malignant neoplasm of breast: Secondary | ICD-10-CM | POA: Diagnosis not present

## 2022-11-02 ENCOUNTER — Ambulatory Visit: Payer: PPO | Attending: Cardiology | Admitting: Cardiology

## 2022-11-02 ENCOUNTER — Encounter: Payer: Self-pay | Admitting: Cardiology

## 2022-11-02 VITALS — BP 129/77 | HR 74 | Ht 69.0 in | Wt 198.2 lb

## 2022-11-02 DIAGNOSIS — I2510 Atherosclerotic heart disease of native coronary artery without angina pectoris: Secondary | ICD-10-CM | POA: Diagnosis not present

## 2022-11-02 DIAGNOSIS — E782 Mixed hyperlipidemia: Secondary | ICD-10-CM

## 2022-11-02 NOTE — Patient Instructions (Signed)
Medication Instructions:   Your physician recommends that you continue on your current medications as directed. Please refer to the Current Medication list given to you today.  *If you need a refill on your cardiac medications before your next appointment, please call your pharmacy*    Follow-Up: At [facility], you and your health needs are our priority.  As part of our continuing mission to provide you with exceptional heart care, we have created designated Provider Care Teams.  These Care Teams include your primary Cardiologist (physician) and Advanced Practice Providers (APPs -  Physician Assistants and Nurse Practitioners) who all work together to provide you with the care you need, when you need it.  We recommend signing up for the patient portal called "MyChart".  Sign up information is provided on this After Visit Summary.  MyChart is used to connect with patients for Virtual Visits (Telemedicine).  Patients are able to view lab/test results, encounter notes, upcoming appointments, etc.  Non-urgent messages can be sent to your provider as well.   To learn more about what you can do with MyChart, go to [email or web address].    Your next appointment:   6 month(s)  Provider:   Dr. Johney Frame

## 2022-12-01 DIAGNOSIS — Z6829 Body mass index (BMI) 29.0-29.9, adult: Secondary | ICD-10-CM | POA: Diagnosis not present

## 2022-12-01 DIAGNOSIS — M778 Other enthesopathies, not elsewhere classified: Secondary | ICD-10-CM | POA: Diagnosis not present

## 2022-12-02 ENCOUNTER — Ambulatory Visit: Payer: PPO | Admitting: Pharmacist Clinician (PhC)/ Clinical Pharmacy Specialist

## 2022-12-20 DIAGNOSIS — E785 Hyperlipidemia, unspecified: Secondary | ICD-10-CM | POA: Diagnosis not present

## 2022-12-20 DIAGNOSIS — E663 Overweight: Secondary | ICD-10-CM | POA: Diagnosis not present

## 2022-12-20 DIAGNOSIS — F419 Anxiety disorder, unspecified: Secondary | ICD-10-CM | POA: Diagnosis not present

## 2022-12-20 DIAGNOSIS — Z6829 Body mass index (BMI) 29.0-29.9, adult: Secondary | ICD-10-CM | POA: Diagnosis not present

## 2022-12-20 DIAGNOSIS — R7303 Prediabetes: Secondary | ICD-10-CM | POA: Diagnosis not present

## 2022-12-22 DIAGNOSIS — Z6829 Body mass index (BMI) 29.0-29.9, adult: Secondary | ICD-10-CM | POA: Diagnosis not present

## 2022-12-22 DIAGNOSIS — Z86010 Personal history of colonic polyps: Secondary | ICD-10-CM | POA: Diagnosis not present

## 2022-12-22 DIAGNOSIS — K648 Other hemorrhoids: Secondary | ICD-10-CM | POA: Diagnosis not present

## 2022-12-26 ENCOUNTER — Encounter: Payer: Self-pay | Admitting: Pharmacist

## 2022-12-29 ENCOUNTER — Encounter: Payer: Self-pay | Admitting: Physical Therapy

## 2022-12-29 ENCOUNTER — Ambulatory Visit: Payer: PPO | Attending: Family Medicine | Admitting: Physical Therapy

## 2022-12-29 ENCOUNTER — Other Ambulatory Visit: Payer: Self-pay

## 2022-12-29 DIAGNOSIS — Z124 Encounter for screening for malignant neoplasm of cervix: Secondary | ICD-10-CM | POA: Diagnosis not present

## 2022-12-29 DIAGNOSIS — Z01419 Encounter for gynecological examination (general) (routine) without abnormal findings: Secondary | ICD-10-CM | POA: Diagnosis not present

## 2022-12-29 DIAGNOSIS — N368 Other specified disorders of urethra: Secondary | ICD-10-CM | POA: Diagnosis not present

## 2022-12-29 DIAGNOSIS — M25531 Pain in right wrist: Secondary | ICD-10-CM | POA: Insufficient documentation

## 2022-12-29 DIAGNOSIS — M6281 Muscle weakness (generalized): Secondary | ICD-10-CM | POA: Diagnosis not present

## 2022-12-29 DIAGNOSIS — R300 Dysuria: Secondary | ICD-10-CM | POA: Diagnosis not present

## 2022-12-29 DIAGNOSIS — Z6829 Body mass index (BMI) 29.0-29.9, adult: Secondary | ICD-10-CM | POA: Diagnosis not present

## 2022-12-29 DIAGNOSIS — M8580 Other specified disorders of bone density and structure, unspecified site: Secondary | ICD-10-CM | POA: Diagnosis not present

## 2022-12-29 DIAGNOSIS — Z01411 Encounter for gynecological examination (general) (routine) with abnormal findings: Secondary | ICD-10-CM | POA: Diagnosis not present

## 2022-12-29 NOTE — Therapy (Signed)
OUTPATIENT PHYSICAL THERAPY CERVICAL EVALUATION   Patient Name: Abigail Byrd MRN: [id number] DOB:03-26-1956, 67 y.o., female Today's Date: 12/29/2022  END OF SESSION:  PT End of Session - 12/29/22 1445     Visit Number 1    Number of Visits 1    Authorization Type [employer]    PT Start Time 1300    PT Stop Time 1340    PT Time Calculation (min) 40 min    Activity Tolerance Patient tolerated treatment well    Behavior During Therapy WFL for tasks assessed/performed             Past Medical History:  Diagnosis Date   Allergy    Anxiety    Asthma    Colon polyp    Duodenitis    Eczema    History of kidney stones    HNP (herniated nucleus pulposus), cervical    Hypercholesteremia    Prediabetes    Past Surgical History:  Procedure Laterality Date   BALLOON DILATION N/A 11/22/2016   Procedure: BALLOON DILATION;  Surgeon: Charolett Bumpers, MD;  Location: [facility];  Service: Endoscopy;  Laterality: N/A;   CESAREAN SECTION     x 3   COLONOSCOPY WITH PROPOFOL N/A 11/22/2016   Procedure: COLONOSCOPY WITH PROPOFOL;  Surgeon: Charolett Bumpers, MD;  Location: [facility];  Service: Endoscopy;  Laterality: N/A;   colonscopy     x 2   ESOPHAGOGASTRODUODENOSCOPY (EGD) WITH PROPOFOL N/A 11/22/2016   Procedure: ESOPHAGOGASTRODUODENOSCOPY (EGD) WITH PROPOFOL;  Surgeon: Charolett Bumpers, MD;  Location: [facility];  Service: Endoscopy;  Laterality: N/A;   LEFT HEART CATH AND CORONARY ANGIOGRAPHY N/A 08/01/2022   Procedure: LEFT HEART CATH AND CORONARY ANGIOGRAPHY;  Surgeon: Runell Gess, MD;  [facility];  Service: Cardiovascular;  Laterality: N/A;   SAVORY DILATION N/A 11/22/2016   Procedure: SAVORY DILATION;  Surgeon: Charolett Bumpers, MD;  [facility]: [facility];  Service: Endoscopy;  Laterality: N/A;   Patient Active Problem List   Diagnosis Date Noted   Elevated coronary artery calcium score 08/01/2022   Unstable angina 08/01/2022   Chest  pain 07/31/2022   Tremor 05/05/2015   Hyperlipidemia 12/22/2013   BMI 31.0-31.9,adult 12/22/2013     REFERRING PROVIDER: Gweneth Dimitri, MD  REFERRING DIAG: Tendinitis of right wrist [M77.8]   THERAPY DIAG:  Muscle weakness (generalized)  Pain in right wrist  Rationale for Evaluation and Treatment: Rehabilitation  ONSET DATE: Nov 2023.   SUBJECTIVE:  SUBJECTIVE STATEMENT: Pt reports having a heart catheterization Nov 2023 and noting an onset of R wrist pain with a burning sensation. She then reports starting a functional strength and body pump class where she was required to place a lot of weight and pressure through her bilat UE's with some of the movements. Pt was prescribed a brace and aleeve. She reports some improvements in symptoms but continues to have an intermittent and random stinging present in her forearm. She is an avid Armed forces operational officertennis player, and would like to get back to this.  Hand dominance: Right  PERTINENT HISTORY:  Allergies, anxiety, Asthma, Pre-diabetic.   PAIN:  Are you having pain? Yes: NPRS scale: 1/10 Pain location: R forearm Pain description: Burning, stinging.  Aggravating factors: Nothing consistent  Relieving factors: Nothing consistent.   PRECAUTIONS: None  WEIGHT BEARING RESTRICTIONS: No  FALLS:  Has patient fallen in last 6 months? No  LIVING ENVIRONMENT: Lives with: lives with their spouse Lives in: House/apartment Stairs: 2 stairs to get into house, 1 set inside with appropriate railings.  Has following equipment at home: None  OCCUPATION: Retired   PLOF: Independent  PATIENT GOALS: Pt would like to get back to her recreational activities without onset of familiar symptoms.   NEXT MD VISIT: 01/12/2023  OBJECTIVE:   DIAGNOSTIC FINDINGS:   None at this time.   COGNITION: Overall cognitive status: Within functional limits for tasks assessed  SENSATION: WFL  POSTURE: No Significant postural limitations  PALPATION: Tenderness throughout forearm but no tension noted.    (Blank rows = not tested)  UPPER EXTREMITY ROM:  Active ROM Right eval Left eval  Shoulders/ Elbows WFL  Wrist flexion 75 80  Wrist extension 30 65  Wrist ulnar deviation 20 18  Wrist radial deviation 12 20  Wrist pronation    Wrist supination     (Blank rows = not tested)  UPPER EXTREMITY MMT:  MMT Right eval Left eval  Elbow flexion 5 4+  Elbow extension 5 4+  Wrist flexion 5 5  Wrist extension 5 5  Wrist ulnar deviation 5 5  Wrist radial deviation 5 5  Wrist pronation    Wrist supination    Grip strength     (Blank rows = not tested) de quervain's syndrome- Negative   TODAY'S TREATMENT:                                                                                                                              DATE: Eval only.   PATIENT EDUCATION:  Education details: Educated pt on anatomy and physiology of current symptoms, diagnosis, prognosis, and POC. Person educated: Patient Education method: Medical illustratorxplanation and Demonstration Education comprehension: verbalized understanding  HOME EXERCISE PROGRAM: None provided today. Pt referred to OT.   ASSESSMENT:  CLINICAL IMPRESSION: Patient is a 67 y.o. F who was seen today for physical therapy evaluation and treatment for forearm pain. She demonstrates full functional ROM in her shoulder and  elbow. Pt with limitations in her wrist mobility. Strength is normal. Pt with tenderness and intermittent burning into her forearm. 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 She has since modified the exercises, but continues to not some pain. Pt was educated on PT vs OT and was encouraged to seek out OT at [place]  [address]. Pt was provided education on location, OT with phone number and address. She plans to call and schedule a follow up eval once she is home.    OBJECTIVE IMPAIRMENTS: decreased coordination, decreased endurance, decreased knowledge of condition, decreased knowledge of use of DME, decreased mobility, decreased ROM, decreased strength, decreased safety awareness, impaired flexibility, impaired sensation, impaired UE functional use, improper body mechanics, and pain.   ACTIVITY LIMITATIONS: carrying, lifting, sleeping, toileting, dressing, and caring for others  PARTICIPATION LIMITATIONS: meal prep, cleaning, laundry, driving, shopping, community activity, and yard work  PERSONAL FACTORS: Age, Behavior pattern, Education, Fitness, Past/current experiences, and 1-2 comorbidities: Allergies, anxiety, Asthma, Pre-diabetic.   are also affecting patient's functional outcome.   REHAB POTENTIAL: Good  CLINICAL DECISION MAKING: Stable/uncomplicated  EVALUATION COMPLEXITY: Low   PLAN FOR NEXT SESSION: Pt to follow up with OT.    Champ Mungo, PT 12/29/2022, 2:45 PM

## 2023-01-02 ENCOUNTER — Other Ambulatory Visit: Payer: Self-pay | Admitting: Obstetrics & Gynecology

## 2023-01-02 DIAGNOSIS — Z1231 Encounter for screening mammogram for malignant neoplasm of breast: Secondary | ICD-10-CM

## 2023-01-02 DIAGNOSIS — M8580 Other specified disorders of bone density and structure, unspecified site: Secondary | ICD-10-CM

## 2023-01-11 NOTE — Progress Notes (Signed)
[facility] [address] [place] [address] Phone: [phone] Subjective:   Abigail Byrd, am serving as a scribe for Dr. Antoine Primas.  I'm seeing this patient by the request  of:  Gweneth Dimitri, MD  CC: Wrist pain  XBM:[id number]  Abigail Byrd is a 67 y.o. female coming in with complaint of R wrist and shoulder pain. Heart cath in Nov has had pain in wrist since. Weakness in wrist and loss of ROM. Was told it was tendonitis. Burning pain that radiates up forearm. Shoulder pain for a couple of months can really feel pain when laying on floor and abducting. Pain in shoulder more discomfort.     Past Medical History:  Diagnosis Date   Allergy    Anxiety    Asthma    Colon polyp    Duodenitis    Eczema    History of kidney stones    HNP (herniated nucleus pulposus), cervical    Hypercholesteremia    Prediabetes    Past Surgical History:  Procedure Laterality Date   BALLOON DILATION N/A 11/22/2016   Procedure: BALLOON DILATION;  Surgeon: Charolett Bumpers, MD;  [facility];  Service: Endoscopy;  Laterality: N/A;   CESAREAN SECTION     x 3   COLONOSCOPY WITH PROPOFOL N/A 11/22/2016   Procedure: COLONOSCOPY WITH PROPOFOL;  Surgeon: Charolett Bumpers, MD;  [facility];  Service: Endoscopy;  Laterality: N/A;   colonscopy     x 2   ESOPHAGOGASTRODUODENOSCOPY (EGD) WITH PROPOFOL N/A 11/22/2016   Procedure: ESOPHAGOGASTRODUODENOSCOPY (EGD) WITH PROPOFOL;  Surgeon: Charolett Bumpers, MD;  [facility];  Service: Endoscopy;  Laterality: N/A;   LEFT HEART CATH AND CORONARY ANGIOGRAPHY N/A 08/01/2022   Procedure: LEFT HEART CATH AND CORONARY ANGIOGRAPHY;  Surgeon: Runell Gess, MD;  [facility];  Service: Cardiovascular;  Laterality: N/A;   SAVORY DILATION N/A 11/22/2016   Procedure: SAVORY DILATION;  Surgeon: Charolett Bumpers, MD;  [facility];  Service: Endoscopy;  Laterality: N/A;    Social History   Socioeconomic History   Marital status: Married    Spouse name: Not on file   Number of children: 3   Years of education: B.S.   Highest education level: Not on file  Occupational History   Occupation: housewife  Tobacco Use   Smoking status: Never   Smokeless tobacco: Never  Substance and Sexual Activity   Alcohol use: Yes    Alcohol/week: 5.0 - 6.0 standard drinks of alcohol    Types: 5 - 6 Standard drinks or equivalent per week    Comment: weekends only 3-4 glasses   Drug use: No   Sexual activity: Not on file  Other Topics Concern   Not on file  Social History Narrative   Patient does not drink caffeine.   Patient is right handed.   Social Determinants of Health   Financial Resource Strain: Not on file  Food Insecurity: No Food Insecurity (07/31/2022)   Hunger Vital Sign    Worried About Running Out of Food in the Last Year: Never true    Ran Out of Food in the Last Year: Never true  Transportation Needs: No Transportation Needs (07/31/2022)   PRAPARE - Administrator, Civil Service (Medical): No    Lack of Transportation (Non-Medical): No  Physical Activity: Not on file  Stress: Not on file  Social Connections: Not on file   Allergies  Allergen Reactions   Celebrex [Celecoxib] Rash   Latex Rash   Family History  Problem Relation Age of Onset   Cancer Mother    Heart disease Mother    Breast cancer Mother    Cancer Father    Breast cancer Paternal Aunt    Heart attack Cousin     Current Outpatient Medications (Endocrine & Metabolic):    metFORMIN (GLUCOPHAGE-XR) 500 MG 24 hr tablet, Take 1,000 mg by mouth every evening.  Current Outpatient Medications (Cardiovascular):    Evolocumab (REPATHA SURECLICK) 140 MG/ML SOAJ, Inject 140 mg into the skin every 14 (fourteen) days.   nitroGLYCERIN (NITROSTAT) 0.4 MG SL tablet, Place 0.4 mg under the tongue every 5 (five) minutes as needed for chest pain.   Rosuvastatin Calcium 40  MG CPSP, Take 40 mg by mouth daily.  Current Outpatient Medications (Respiratory):    budesonide-formoterol (SYMBICORT) 160-4.5 MCG/ACT inhaler, 2 puffs Inhalation Twice a day as needed   cetirizine (ZYRTEC) 10 MG tablet, Take 10 mg by mouth daily as needed for allergies.   diphenhydrAMINE (BENADRYL ALLERGY) 25 mg capsule, Take 1 capsule as needed by oral route.  Current Outpatient Medications (Analgesics):    aspirin EC 81 MG tablet, Take 81 mg by mouth daily. Swallow whole.   Current Outpatient Medications (Other):    ALPRAZolam (XANAX) 0.25 MG tablet, Take 0.25 mg by mouth daily as needed for anxiety.   augmented betamethasone dipropionate (DIPROLENE-AF) 0.05 % ointment, 1 application Externally twice a day for 14 days   calcium carbonate (CALCIUM 600) 600 MG TABS tablet, 1 tablet Orally once a day   Carboxymethylcell-Hypromellose (GENTEAL OP), Place 1 drop into both eyes at bedtime.   cholecalciferol (VITAMIN D) 1000 units tablet, Take 1,000 Units by mouth daily.   escitalopram (LEXAPRO) 10 MG tablet, Take 5 mg by mouth daily. May increase to 10 mg after 7 days.   fluocinonide cream (LIDEX) 0.05 %, as directed Externally not for face or skin folds once daily for 2 weeks   tretinoin (RETIN-A) 0.05 % cream, APPLY EXTERNALLY TO THE AFFECTED AREA OF THE FACE IN THE EVENING ONCE DAILY for 20   Reviewed prior external information including notes and imaging from  primary care provider As well as notes that were available from care everywhere and other healthcare systems.  Past medical history, social, surgical and family history all reviewed in electronic medical record.  No pertanent information unless stated regarding to the chief complaint.   Review of Systems:  No headache, visual changes, nausea, vomiting, diarrhea, constipation, dizziness, abdominal pain, skin rash, fevers, chills, night sweats, weight loss, swollen lymph nodes, body aches, joint swelling, chest pain, shortness of  breath, mood changes. POSITIVE muscle aches  Objective  Blood pressure 110/70, pulse 73, height 5\' 9"  (1.753 m), weight 204 lb (92.5 kg), SpO2 96 %.   General: No apparent distress alert and oriented x3 mood and affect normal, dressed appropriately.  HEENT: Pupils equal, extraocular movements intact  Respiratory: Patient's speak in full sentences and does not appear short of breath  Cardiovascular: No lower extremity edema, non tender, no erythema  Right wrist exam shows patient does have some tenderness to palpation noted over the dorsal aspect of the wrist.  Patient does have arthritic changes noted of the [facility] joint noted.  Mild positive FABER test.  Limited muscular skeletal ultrasound was performed and interpreted by Antoine Primas, M  Hypoechoic changes are noted in the third dorsal compartment of the wrist consistent with more  of a tendinitis.  Significant degenerative arthritis of the scaphoid lunate as well as over the [facility] joint patient's median nerve appears to be normal. 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

## 2023-01-12 ENCOUNTER — Other Ambulatory Visit: Payer: Self-pay

## 2023-01-12 ENCOUNTER — Ambulatory Visit (INDEPENDENT_AMBULATORY_CARE_PROVIDER_SITE_OTHER): Payer: PPO

## 2023-01-12 ENCOUNTER — Ambulatory Visit (INDEPENDENT_AMBULATORY_CARE_PROVIDER_SITE_OTHER): Payer: PPO | Admitting: Family Medicine

## 2023-01-12 VITALS — BP 110/70 | HR 73 | Ht 69.0 in | Wt 204.0 lb

## 2023-01-12 DIAGNOSIS — M25531 Pain in right wrist: Secondary | ICD-10-CM

## 2023-01-12 DIAGNOSIS — M778 Other enthesopathies, not elsewhere classified: Secondary | ICD-10-CM

## 2023-01-12 NOTE — Patient Instructions (Addendum)
Brace at night Do prescribed exercises at least 3x a week Voltaren gel Ice after activity Thicker grip lighter racket Tape your wrist for tennis See you again in 6-8 weeks Xray today

## 2023-01-13 ENCOUNTER — Encounter: Payer: Self-pay | Admitting: Family Medicine

## 2023-01-13 DIAGNOSIS — M778 Other enthesopathies, not elsewhere classified: Secondary | ICD-10-CM | POA: Insufficient documentation

## 2023-01-13 NOTE — Assessment & Plan Note (Signed)
1 tendinitis of the third compartment.  We discussed different ergonomic changes with patient doing more tenderness.  Discussed with patient about wrapping it, wearing a brace at night, icing regimen and topical anti-inflammatories.  Worsening pain will consider injection.  Follow-up with me again in 6 to 8 weeks

## 2023-01-17 DIAGNOSIS — R7303 Prediabetes: Secondary | ICD-10-CM | POA: Diagnosis not present

## 2023-01-17 DIAGNOSIS — R7309 Other abnormal glucose: Secondary | ICD-10-CM | POA: Diagnosis not present

## 2023-01-24 ENCOUNTER — Telehealth: Payer: Self-pay | Admitting: Gastroenterology

## 2023-01-24 NOTE — Telephone Encounter (Signed)
Good afternoon Dr. Myrtie Neither,   Patient called to schedule a Colonoscopy. She is requesting to transfer her care specifically over to you. States she is requesting a transfer of care due to Dr. Danise Edge retiring. Her last colonoscopy was in 2018, she also states that she should be due this year for her 5 year recall date. Her records are in [facility] for you to review and advise on scheduling.   Thank you.

## 2023-01-24 NOTE — Telephone Encounter (Signed)
(  I reviewed this patient's March 2018 colonoscopy report as well as a 2023 cardiac catheterization report (at which time note coronary intervention was taken) and normal LV function was recorded.)  This patient can be directly booked for colonoscopy with me in the [facility] for history of colon polyps.  - HD

## 2023-01-25 ENCOUNTER — Encounter: Payer: Self-pay | Admitting: Gastroenterology

## 2023-01-26 DIAGNOSIS — I2510 Atherosclerotic heart disease of native coronary artery without angina pectoris: Secondary | ICD-10-CM | POA: Diagnosis not present

## 2023-01-26 DIAGNOSIS — Z0000 Encounter for general adult medical examination without abnormal findings: Secondary | ICD-10-CM | POA: Diagnosis not present

## 2023-01-26 DIAGNOSIS — M8580 Other specified disorders of bone density and structure, unspecified site: Secondary | ICD-10-CM | POA: Diagnosis not present

## 2023-01-26 DIAGNOSIS — Z87442 Personal history of urinary calculi: Secondary | ICD-10-CM | POA: Diagnosis not present

## 2023-01-26 DIAGNOSIS — R7303 Prediabetes: Secondary | ICD-10-CM | POA: Diagnosis not present

## 2023-01-26 DIAGNOSIS — K200 Eosinophilic esophagitis: Secondary | ICD-10-CM | POA: Diagnosis not present

## 2023-01-26 DIAGNOSIS — J45909 Unspecified asthma, uncomplicated: Secondary | ICD-10-CM | POA: Diagnosis not present

## 2023-01-26 DIAGNOSIS — M778 Other enthesopathies, not elsewhere classified: Secondary | ICD-10-CM | POA: Diagnosis not present

## 2023-01-26 DIAGNOSIS — E785 Hyperlipidemia, unspecified: Secondary | ICD-10-CM | POA: Diagnosis not present

## 2023-01-26 DIAGNOSIS — Z86010 Personal history of colonic polyps: Secondary | ICD-10-CM | POA: Diagnosis not present

## 2023-01-26 DIAGNOSIS — Z6829 Body mass index (BMI) 29.0-29.9, adult: Secondary | ICD-10-CM | POA: Diagnosis not present

## 2023-01-26 DIAGNOSIS — F419 Anxiety disorder, unspecified: Secondary | ICD-10-CM | POA: Diagnosis not present

## 2023-01-29 IMAGING — MG MM DIGITAL SCREENING BILAT W/ TOMO AND CAD
8 series · 8 of 24 positions shown · non-contrast
Comparison: Previous exam(s).

CLINICAL DATA: Screening.

EXAM:
DIGITAL SCREENING BILATERAL MAMMOGRAM WITH TOMOSYNTHESIS AND CAD
TECHNIQUE: Bilateral screening digital craniocaudal and mediolateral oblique
mammograms were obtained. Bilateral screening digital breast
tomosynthesis was performed. The images were evaluated with
computer-aided detection.

[R CC synth-2D]
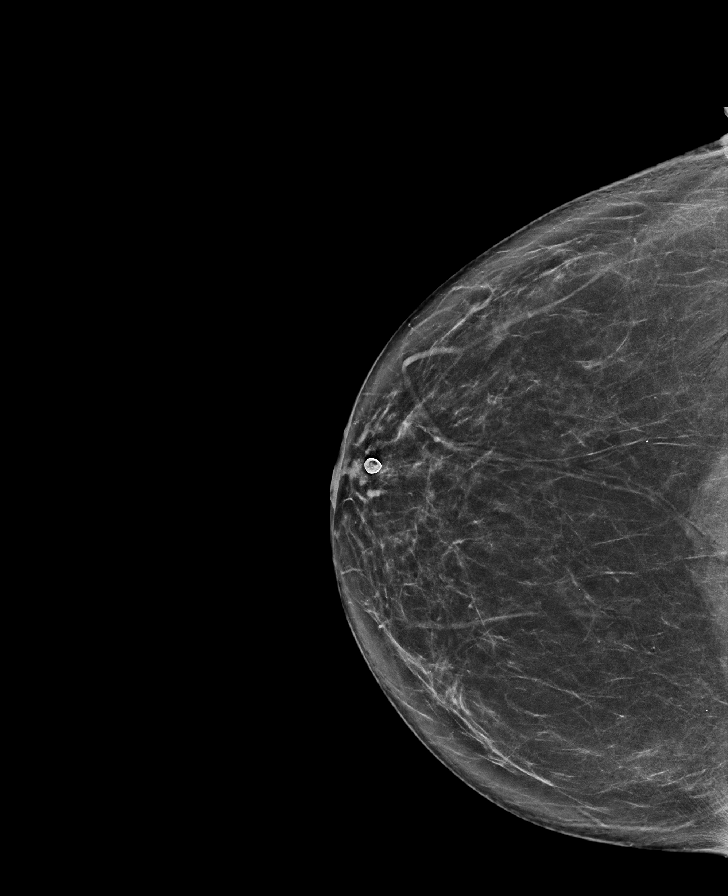

[L CC synth-2D]
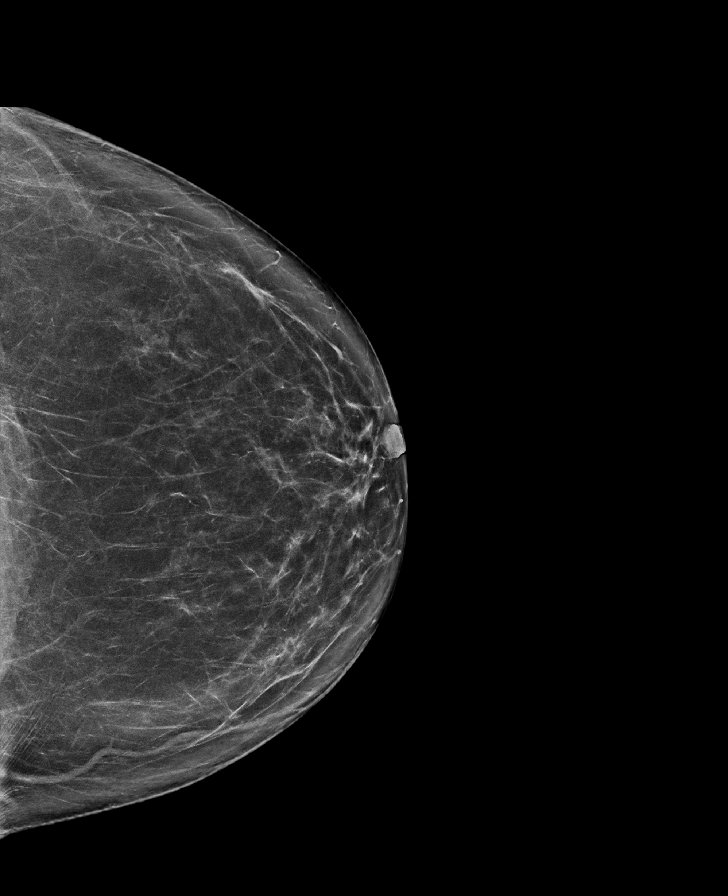

[R MLO synth-2D]
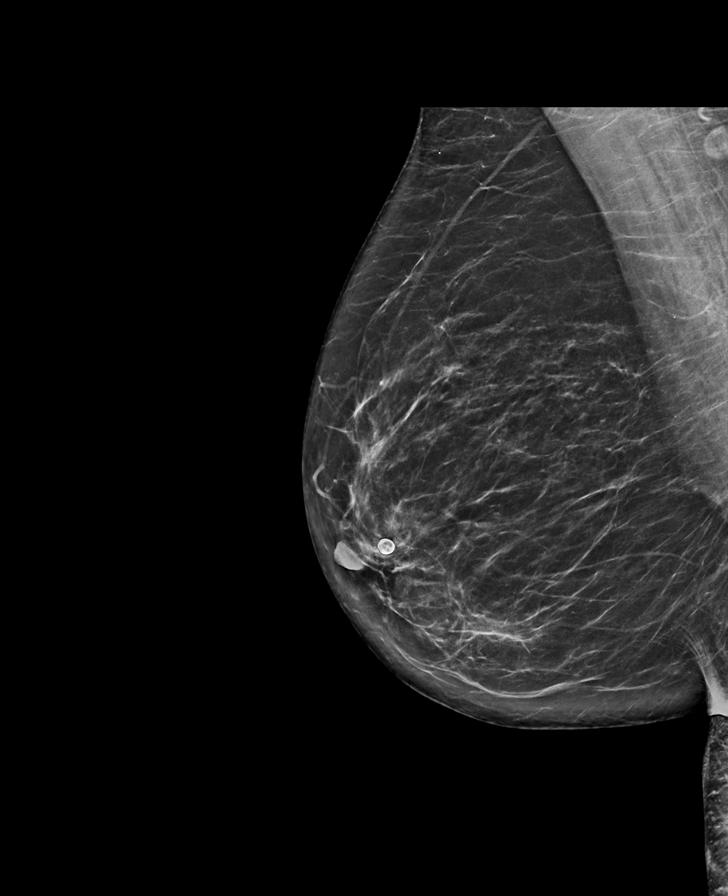

[L MLO synth-2D]
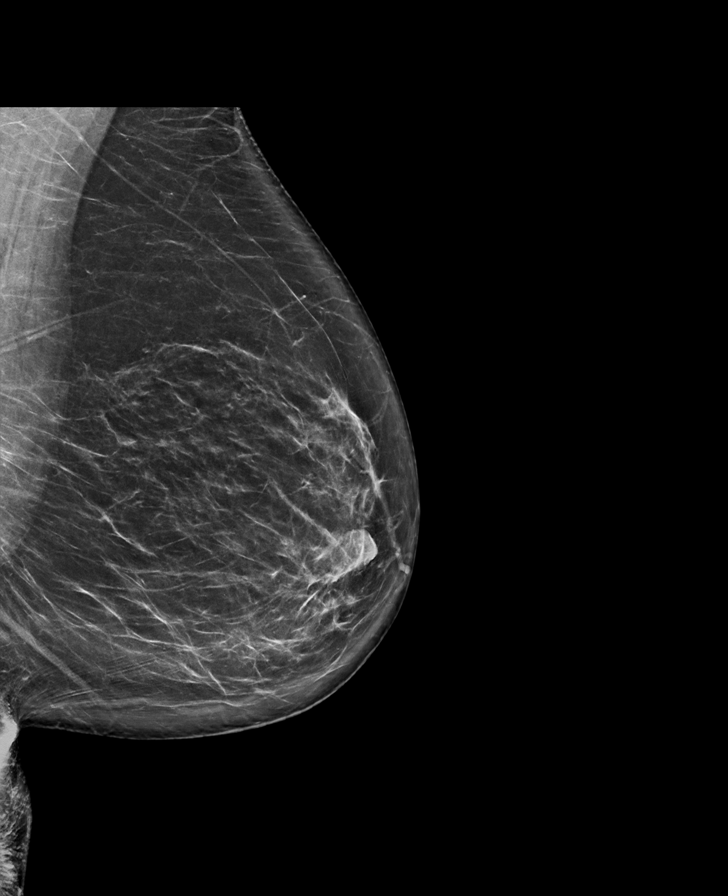

[R MLO tomo · tomo slice 44/87.0]
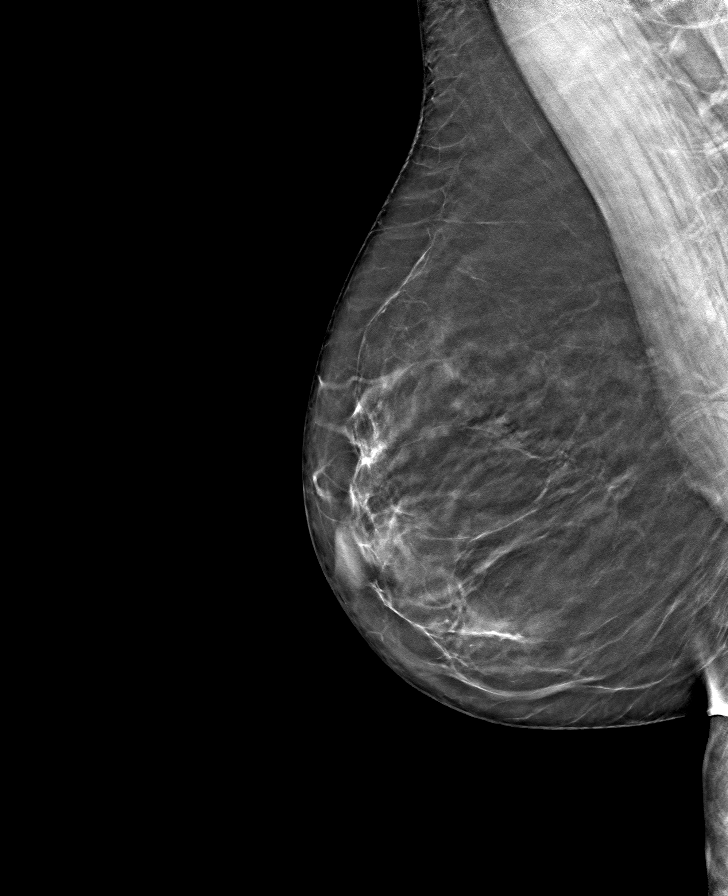

[L MLO tomo · tomo slice 45/88.0]
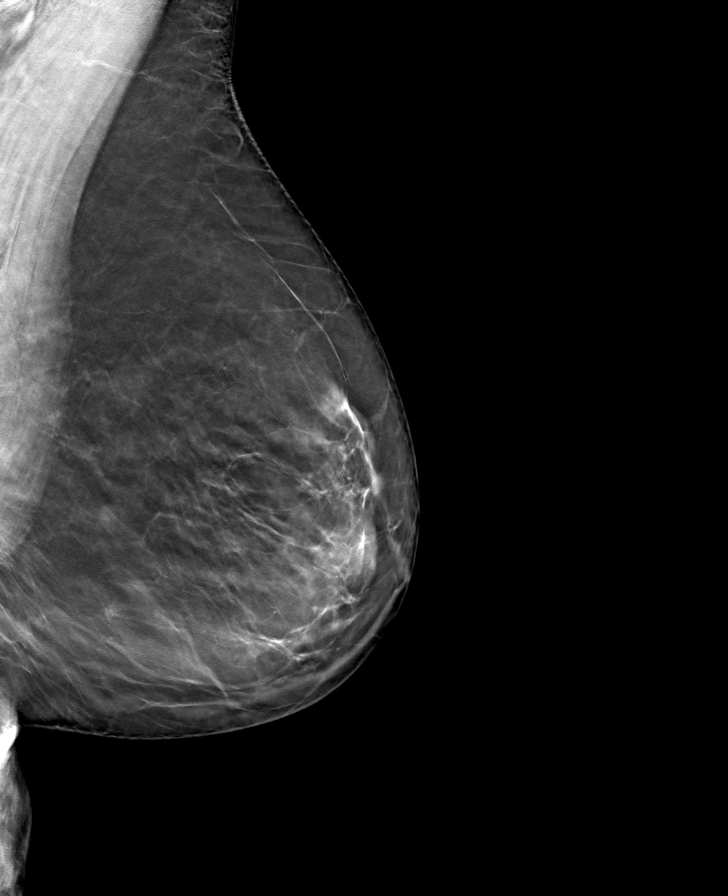

[L CC tomo · tomo slice 41/82.0]
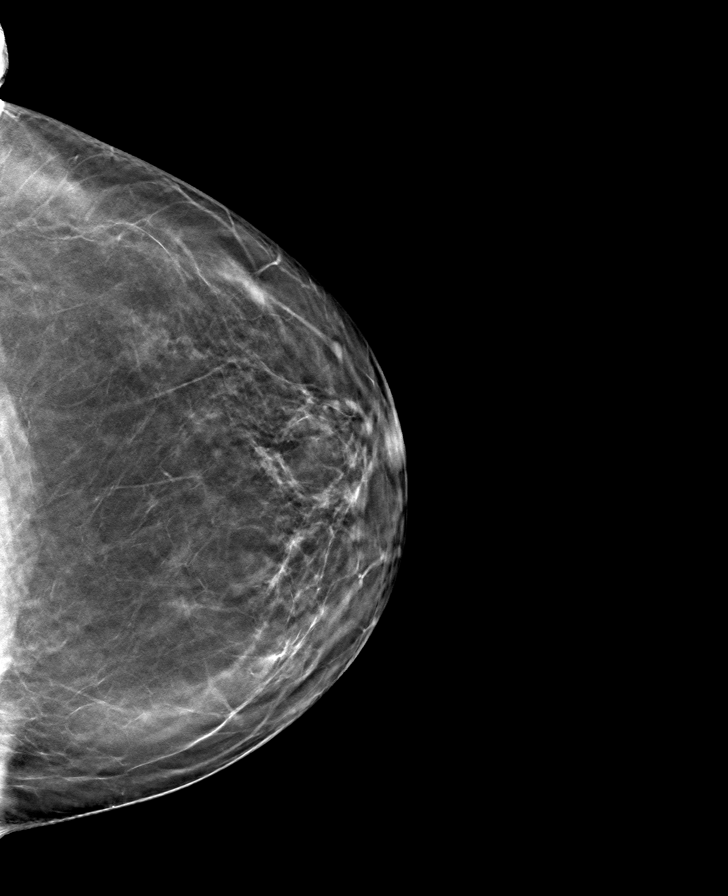

[R CC tomo · tomo slice 39/78.0]
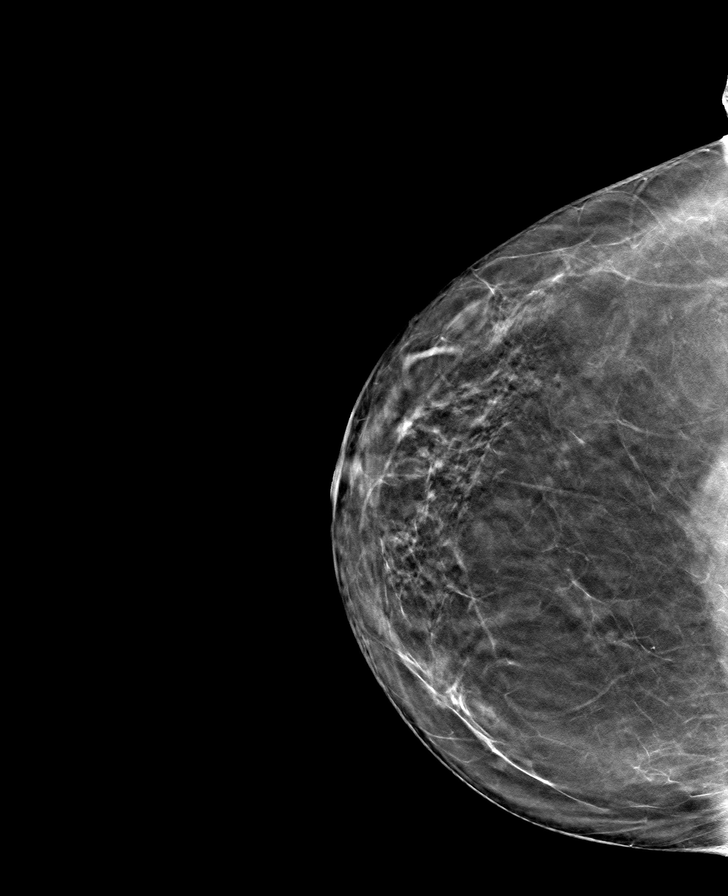

[8 of 24 positions shown; findings below may reference images not displayed]

ACR Breast Density Category b: There are scattered areas of
fibroglandular density.
FINDINGS: There are no findings suspicious for malignancy.
IMPRESSION: No mammographic evidence of malignancy. A result letter of this
screening mammogram will be mailed directly to the patient.

RECOMMENDATION:
Screening mammogram in one year. (Code:51-O-LD2)

BI-RADS CATEGORY  1: Negative.

## 2023-02-07 ENCOUNTER — Ambulatory Visit (AMBULATORY_SURGERY_CENTER): Payer: PPO

## 2023-02-07 VITALS — Ht 69.0 in | Wt 199.0 lb

## 2023-02-07 DIAGNOSIS — L821 Other seborrheic keratosis: Secondary | ICD-10-CM | POA: Insufficient documentation

## 2023-02-07 DIAGNOSIS — L304 Erythema intertrigo: Secondary | ICD-10-CM | POA: Insufficient documentation

## 2023-02-07 DIAGNOSIS — J45909 Unspecified asthma, uncomplicated: Secondary | ICD-10-CM | POA: Insufficient documentation

## 2023-02-07 DIAGNOSIS — L810 Postinflammatory hyperpigmentation: Secondary | ICD-10-CM | POA: Insufficient documentation

## 2023-02-07 DIAGNOSIS — L309 Dermatitis, unspecified: Secondary | ICD-10-CM | POA: Insufficient documentation

## 2023-02-07 DIAGNOSIS — Z86010 Personal history of colonic polyps: Secondary | ICD-10-CM

## 2023-02-07 DIAGNOSIS — M8580 Other specified disorders of bone density and structure, unspecified site: Secondary | ICD-10-CM | POA: Insufficient documentation

## 2023-02-07 DIAGNOSIS — N9089 Other specified noninflammatory disorders of vulva and perineum: Secondary | ICD-10-CM | POA: Insufficient documentation

## 2023-02-07 DIAGNOSIS — L570 Actinic keratosis: Secondary | ICD-10-CM | POA: Insufficient documentation

## 2023-02-07 DIAGNOSIS — L84 Corns and callosities: Secondary | ICD-10-CM | POA: Insufficient documentation

## 2023-02-07 DIAGNOSIS — N952 Postmenopausal atrophic vaginitis: Secondary | ICD-10-CM | POA: Insufficient documentation

## 2023-02-07 DIAGNOSIS — B351 Tinea unguium: Secondary | ICD-10-CM | POA: Insufficient documentation

## 2023-02-07 DIAGNOSIS — D225 Melanocytic nevi of trunk: Secondary | ICD-10-CM | POA: Insufficient documentation

## 2023-02-07 DIAGNOSIS — E2839 Other primary ovarian failure: Secondary | ICD-10-CM | POA: Insufficient documentation

## 2023-02-07 DIAGNOSIS — R6882 Decreased libido: Secondary | ICD-10-CM | POA: Insufficient documentation

## 2023-02-07 MED ORDER — NA SULFATE-K SULFATE-MG SULF 17.5-3.13-1.6 GM/177ML PO SOLN: 1.0000 | Freq: Once | ORAL | 0 refills | Status: AC

## 2023-02-07 NOTE — Progress Notes (Signed)
No egg or soy allergy known to patient  No issues known to pt with past sedation with any surgeries or procedures Patient denies ever being told they had issues or difficulty with intubation  No FH of Malignant Hyperthermia Pt is not on diet pills Pt is not on  home 02  Pt is not on blood thinners  Pt denies issues with constipation  No A fib or A flutter Have any cardiac testing pending--no  Pt is ambulatory   Patient's chart reviewed by Cathlyn Parsons CNRA prior to previsit and patient appropriate for the [facility].  Previsit completed and red dot placed by patient's name on their procedure day (on provider's schedule).     PV completed with patient. Prep reviewed instructions sent to via mychart and to address on file. Goodrx coupon for [facility] provided.  Pt instructed to use Singlecare.com or GoodRx for a price reduction on prep

## 2023-02-10 ENCOUNTER — Encounter: Payer: Self-pay | Admitting: Pharmacist

## 2023-02-15 ENCOUNTER — Other Ambulatory Visit (HOSPITAL_COMMUNITY): Payer: Self-pay

## 2023-02-17 ENCOUNTER — Other Ambulatory Visit (HOSPITAL_COMMUNITY): Payer: Self-pay

## 2023-02-17 ENCOUNTER — Telehealth: Payer: Self-pay

## 2023-02-17 ENCOUNTER — Encounter: Payer: Self-pay | Admitting: Pharmacist

## 2023-02-17 NOTE — Telephone Encounter (Signed)
Pharmacy Patient Advocate Encounter   Received notification from [employer] that prior authorization for PRALUENT is needed.    PA submitted on 02/17/23 Key [id number] Status is pending  Haze Rushing, CPhT Pharmacy Patient Advocate Specialist Direct Number: [phone] Fax: [phone]

## 2023-02-17 NOTE — Telephone Encounter (Signed)
Pharmacy Patient Advocate Encounter  Prior Authorization for PRALUENT 75 MG/ML has been approved.    Effective dates: 02/17/23 through 08/19/23  Haze Rushing, CPhT Pharmacy Patient Advocate Specialist Direct Number: [phone] Fax: [phone]

## 2023-02-20 ENCOUNTER — Telehealth: Payer: Self-pay | Admitting: Cardiology

## 2023-02-20 NOTE — Telephone Encounter (Signed)
Pt c/o medication issue:  1. Name of Medication: Evolocumab (REPATHA SURECLICK) 140 MG/ML SOAJ   2. How are you currently taking this medication (dosage and times per day)?    3. Are you having a reaction (difficulty breathing--STAT)? no  4. What is your medication issue? Patient states that she has had reaction to the above medication. But her insurance offer her another medication that can replace it. She states it starts with a P but unsure of the name. Please advise

## 2023-02-21 ENCOUNTER — Other Ambulatory Visit (HOSPITAL_COMMUNITY): Payer: Self-pay

## 2023-02-21 MED ORDER — PRALUENT 75 MG/ML ~~LOC~~ SOAJ: 75.0000 mg | SUBCUTANEOUS | 11 refills | Status: DC

## 2023-02-21 NOTE — Progress Notes (Unsigned)
[name] [address] [place] [address] Phone: [phone] Subjective:   Abigail Byrd, am serving as a scribe for Dr. Antoine Primas.  I'm seeing this patient by the request  of:  Gweneth Dimitri, MD  CC: wrist pain   [id number]:[id number]  01/12/2023 1 tendinitis of the third compartment.  We discussed different ergonomic changes with patient doing more tenderness.  Discussed with patient about wrapping it, wearing a brace at night, icing regimen and topical anti-inflammatories.  Worsening pain will consider injection.  Follow-up with me again in 6 to 8 weeks     Updated 02/23/2023 Abigail Byrd is a 67 y.o. female coming in with complaint of wrist pain. Feels like wrist is doing better. Has been wearing brace and taping. Still doing exercises. Had a flare in the thumb, possibly from over doing it on the exercises.        Past Medical History:  Diagnosis Date   Acid reflux    Allergy    Anxiety    Asthma    Colon polyp    Duodenitis    Eczema    History of kidney stones    HNP (herniated nucleus pulposus), cervical    Hypercholesteremia    Prediabetes    Past Surgical History:  Procedure Laterality Date   BALLOON DILATION N/A 11/22/2016   Procedure: BALLOON DILATION;  Surgeon: Charolett Bumpers, MD;  Location: [facility];  Service: Endoscopy;  Laterality: N/A;   CESAREAN SECTION     x 3   COLONOSCOPY WITH PROPOFOL N/A 11/22/2016   Procedure: COLONOSCOPY WITH PROPOFOL;  Surgeon: Charolett Bumpers, MD;  Location: [facility];  Service: Endoscopy;  Laterality: N/A;   colonscopy     x 2   ESOPHAGOGASTRODUODENOSCOPY (EGD) WITH PROPOFOL N/A 11/22/2016   Procedure: ESOPHAGOGASTRODUODENOSCOPY (EGD) WITH PROPOFOL;  Surgeon: Charolett Bumpers, MD;  Location: [facility];  Service: Endoscopy;  Laterality: N/A;   LEFT HEART CATH AND CORONARY ANGIOGRAPHY N/A 08/01/2022   Procedure: LEFT HEART CATH AND CORONARY ANGIOGRAPHY;  Surgeon: Runell Gess,  MD;  [facility]: [facility];  Service: Cardiovascular;  Laterality: N/A;   SAVORY DILATION N/A 11/22/2016   Procedure: SAVORY DILATION;  Surgeon: Charolett Bumpers, MD;  Location: [facility];  Service: Endoscopy;  Laterality: N/A;   Social History   Socioeconomic History   Marital status: Married    Spouse name: Not on file   Number of children: 3   Years of education: B.S.   Highest education level: Not on file  Occupational History   Occupation: housewife  Tobacco Use   Smoking status: Never   Smokeless tobacco: Never  Vaping Use   Vaping Use: Never used  Substance and Sexual Activity   Alcohol use: Yes    Alcohol/week: 5.0 - 6.0 standard drinks of alcohol    Types: 5 - 6 Standard drinks or equivalent per week    Comment: weekends only 3-4 glasses   Drug use: No   Sexual activity: Not on file  Other Topics Concern   Not on file  Social History Narrative   Patient does not drink caffeine.   Patient is right handed.   Social Determinants of Health   Financial Resource Strain: Not on file  Food Insecurity: No Food Insecurity (07/31/2022)   Hunger Vital Sign    Worried About Running Out of Food in the Last Year: Never true    Ran Out of Food in the Last  Year: Never true  Transportation Needs: No Transportation Needs (07/31/2022)   PRAPARE - Administrator, Civil Service (Medical): No    Lack of Transportation (Non-Medical): No  Physical Activity: Not on file  Stress: Not on file  Social Connections: Not on file   Allergies  Allergen Reactions   Repatha [Evolocumab]     rash   Celebrex [Celecoxib] Rash   Latex Rash   Family History  Problem Relation Age of Onset   Cancer Mother    Heart disease Mother    Breast cancer Mother    Cancer Father    Colon cancer Paternal Aunt        60s   Breast cancer Paternal Aunt    Heart attack Cousin    Colon polyps Neg Hx    Esophageal cancer Neg Hx    Rectal cancer Neg Hx    Stomach cancer Neg Hx      Current Outpatient Medications (Endocrine & Metabolic):    metFORMIN (GLUCOPHAGE-XR) 500 MG 24 hr tablet, Take 1,000 mg by mouth every evening.  Current Outpatient Medications (Cardiovascular):    Alirocumab (PRALUENT) 75 MG/ML SOAJ, Inject 1 mL (75 mg total) into the skin every 14 (fourteen) days.   nitroGLYCERIN (NITROSTAT) 0.4 MG SL tablet, Place 0.4 mg under the tongue every 5 (five) minutes as needed for chest pain.   Rosuvastatin Calcium 40 MG CPSP, Take 40 mg by mouth daily.  Current Outpatient Medications (Respiratory):    budesonide-formoterol (SYMBICORT) 160-4.5 MCG/ACT inhaler, 2 puffs Inhalation Twice a day as needed   cetirizine (ZYRTEC) 10 MG tablet, Take 10 mg by mouth daily as needed for allergies.   diphenhydrAMINE (BENADRYL ALLERGY) 25 mg capsule, Take 1 capsule as needed by oral route.  Current Outpatient Medications (Analgesics):    aspirin EC 81 MG tablet, Take 81 mg by mouth daily. Swallow whole.   Current Outpatient Medications (Other):    ALPRAZolam (XANAX) 0.25 MG tablet, Take 0.25 mg by mouth daily as needed for anxiety.   augmented betamethasone dipropionate (DIPROLENE-AF) 0.05 % ointment, 1 application Externally twice a day for 14 days   calcium carbonate (CALCIUM 600) 600 MG TABS tablet, 1 tablet Orally once a day   Carboxymethylcell-Hypromellose (GENTEAL OP), Place 1 drop into both eyes at bedtime.   cholecalciferol (VITAMIN D) 1000 units tablet, Take 1,000 Units by mouth daily.   escitalopram (LEXAPRO) 10 MG tablet, Take 15 mg by mouth daily. May increase to 10 mg after 7 days.   fluocinonide cream (LIDEX) 0.05 %, as directed Externally not for face or skin folds once daily for 2 weeks   tretinoin (RETIN-A) 0.05 % cream, APPLY EXTERNALLY TO THE AFFECTED AREA OF THE FACE IN THE EVENING ONCE DAILY for 20   Reviewed prior external information including notes and imaging from  primary care provider As well as notes that were available from care  everywhere and other healthcare systems.  Past medical history, social, surgical and family history all reviewed in electronic medical record.  No pertanent information unless stated regarding to the chief complaint.   Review of Systems:  No headache, visual changes, nausea, vomiting, diarrhea, constipation, dizziness, abdominal pain, skin rash, fevers, chills, night sweats, weight loss, swollen lymph nodes, body aches, joint swelling, chest pain, shortness of breath, mood changes. POSITIVE muscle aches  Objective  Blood pressure 118/74, pulse (!) 59, height 5\' 9"  (1.753 m), weight 202 lb (91.6 kg), SpO2 95 %.   General: No apparent distress alert  and oriented x3 mood and affect normal, dressed appropriately.  HEENT: Pupils equal, extraocular movements intact  Respiratory: Patient's speak in full sentences and does not appear short of breath  Cardiovascular: No lower extremity edema, non tender, no erythema  Wrist exam shows patient does have some tenderness to palpation noted.  Good range of motion of the wrist noted. Fullness of the dorsal aspect of the wrist.  Patient noted does have some swelling of the [facility] joint and does have a positive grind test noted.  Limited muscular skeletal ultrasound was performed and interpreted by Antoine Primas, M  Limited ultrasound of the right wrist shows significant decrease in the hypoechoic changes in the third compartment that was noted previously dorsally.  Patient does have arthritic changes noted of the [facility] joint with hypoechoic changes consistent with a small effusion. Impression: Interval improvement in the tendinitis but worsening of the [facility] arthritis   Impression and Recommendations:     The above documentation has been reviewed and is accurate and complete Judi Saa, DO

## 2023-02-21 NOTE — Telephone Encounter (Addendum)
Praluent rx sent to pharmacy to replace Repatha, pt aware. Main side effect with Repatha was redness, itching, and hard bump where she would inject Repatha, would get bigger with each subsequent injection. Allergy list updated. She has a latex allergy (rash) and Repatha contains latex, discussed this may have been the issue. She will call if she develops any similar issues with Praluent but discussed that Praluent is latex free so hopefully she will tolerate better. Pt appreciative for the call.

## 2023-02-21 NOTE — Telephone Encounter (Signed)
Spoke with pt on the phone, see separate phone encounter. She is starting Praluent.

## 2023-02-21 NOTE — Telephone Encounter (Signed)
Patient is requesting this medication be sent in to her pharmacy since it has now been approved. Requesting call back to confirm.

## 2023-02-23 ENCOUNTER — Ambulatory Visit (INDEPENDENT_AMBULATORY_CARE_PROVIDER_SITE_OTHER): Payer: PPO | Admitting: Family Medicine

## 2023-02-23 ENCOUNTER — Other Ambulatory Visit: Payer: Self-pay

## 2023-02-23 ENCOUNTER — Encounter: Payer: Self-pay | Admitting: Family Medicine

## 2023-02-23 VITALS — BP 118/74 | HR 59 | Ht 69.0 in | Wt 202.0 lb

## 2023-02-23 DIAGNOSIS — M25531 Pain in right wrist: Secondary | ICD-10-CM

## 2023-02-23 DIAGNOSIS — M778 Other enthesopathies, not elsewhere classified: Secondary | ICD-10-CM

## 2023-02-23 DIAGNOSIS — M1811 Unilateral primary osteoarthritis of first carpometacarpal joint, right hand: Secondary | ICD-10-CM | POA: Diagnosis not present

## 2023-02-23 NOTE — Patient Instructions (Signed)
Enjoy the house when its finalized With any acute pain try icing and glove liners at night See you again in 3 months

## 2023-02-23 NOTE — Assessment & Plan Note (Signed)
Wrist tendinitis seems to be doing well overall.  Discussed with patient about icing regimen and home exercises, discussed which activities to do and which ones to avoid.  Increase activity slowly over the course of next several weeks.  Discussed continuing the bracing at night as well as taping with activities.  Follow-up in 2 to 3 months if not perfect

## 2023-02-23 NOTE — Assessment & Plan Note (Signed)
Arthritis of the [facility] joint noted.  Discussed icing regimen and home exercises, which activities to do and which ones to avoid.  Discussed wearing gloves at night.  Follow-up again in 6 to 8 weeks

## 2023-03-03 ENCOUNTER — Encounter: Payer: PPO | Admitting: Gastroenterology

## 2023-03-07 ENCOUNTER — Encounter: Payer: Self-pay | Admitting: Gastroenterology

## 2023-03-13 DIAGNOSIS — E663 Overweight: Secondary | ICD-10-CM | POA: Diagnosis not present

## 2023-03-13 DIAGNOSIS — E785 Hyperlipidemia, unspecified: Secondary | ICD-10-CM | POA: Diagnosis not present

## 2023-03-13 DIAGNOSIS — R7303 Prediabetes: Secondary | ICD-10-CM | POA: Diagnosis not present

## 2023-03-13 DIAGNOSIS — Z6829 Body mass index (BMI) 29.0-29.9, adult: Secondary | ICD-10-CM | POA: Diagnosis not present

## 2023-03-13 DIAGNOSIS — F419 Anxiety disorder, unspecified: Secondary | ICD-10-CM | POA: Diagnosis not present

## 2023-03-28 ENCOUNTER — Encounter: Payer: Self-pay | Admitting: Gastroenterology

## 2023-03-28 ENCOUNTER — Ambulatory Visit (AMBULATORY_SURGERY_CENTER): Payer: PPO | Admitting: Gastroenterology

## 2023-03-28 VITALS — BP 111/68 | HR 43 | Temp 96.2°F | Resp 15 | Ht 69.0 in | Wt 199.0 lb

## 2023-03-28 DIAGNOSIS — F419 Anxiety disorder, unspecified: Secondary | ICD-10-CM | POA: Diagnosis not present

## 2023-03-28 DIAGNOSIS — R7303 Prediabetes: Secondary | ICD-10-CM | POA: Diagnosis not present

## 2023-03-28 DIAGNOSIS — Z86010 Personal history of colonic polyps: Secondary | ICD-10-CM

## 2023-03-28 DIAGNOSIS — Z09 Encounter for follow-up examination after completed treatment for conditions other than malignant neoplasm: Secondary | ICD-10-CM | POA: Diagnosis not present

## 2023-03-28 MED ORDER — SODIUM CHLORIDE 0.9 % IV SOLN: 500.0000 mL | INTRAVENOUS | Status: DC

## 2023-03-28 NOTE — Op Note (Signed)
[facility] Patient Name: Abigail Byrd Procedure Date: 03/28/2023 11:07 AM MRN: [id number] Endoscopist: Sherilyn Cooter L. Myrtie Neither , MD, [id number] Age: 67 Referring MD:  Date of Birth: October 08, 1955 Gender: Female Account #: [id number] Procedure:                Colonoscopy Indications:              Surveillance: Personal history of adenomatous                            polyps on last colonoscopy > 5 years ago                           No polyps on last colonoscopy March 2018 (Dr.                            Danise Edge) -that exam report indicates                            "history of nonadvanced adenoma" Medicines:                Monitored Anesthesia Care Procedure:                Pre-Anesthesia Assessment:                           - Prior to the procedure, a History and Physical                            was performed, and patient medications and                            allergies were reviewed. The patient's tolerance of                            previous anesthesia was also reviewed. The risks                            and benefits of the procedure and the sedation                            options and risks were discussed with the patient.                            All questions were answered, and informed consent                            was obtained. Prior Anticoagulants: The patient has                            taken no anticoagulant or antiplatelet agents. ASA                            Grade Assessment: II - A patient with mild systemic  disease. After reviewing the risks and benefits,                            the patient was deemed in satisfactory condition to                            undergo the procedure.                           After obtaining informed consent, the colonoscope                            was passed under direct vision. Throughout the                            procedure, the patient's blood pressure, pulse, and                             oxygen saturations were monitored continuously. The                            CF [id number] #[id number] was introduced through the anus                            and advanced to the the cecum, identified by                            appendiceal orifice and ileocecal valve. The                            colonoscopy was somewhat difficult due to a                            redundant colon. Successful completion of the                            procedure was aided by using manual pressure and                            straightening and shortening the scope to obtain                            bowel loop reduction. The patient tolerated the                            procedure well. The quality of the bowel                            preparation was excellent. The ileocecal valve,                            appendiceal orifice, and rectum were photographed.  The bowel preparation used was SUPREP via split                            dose instruction. Scope In: 11:24:34 AM Scope Out: 11:41:21 AM Scope Withdrawal Time: 0 hours 11 minutes 34 seconds  Total Procedure Duration: 0 hours 16 minutes 47 seconds  Findings:                 The perianal and digital rectal examinations were                            normal.                           Repeat examination of right colon under NBI                            performed.                           Multiple diverticula were found in the left colon.                           Internal hemorrhoids were found. The hemorrhoids                            were small.                           The exam was otherwise without abnormality on                            direct and retroflexion views. Complications:            No immediate complications. Estimated Blood Loss:     Estimated blood loss: none. Impression:               - Diverticulosis in the left colon.                           - Internal  hemorrhoids.                           - The examination was otherwise normal on direct                            and retroflexion views.                           - No specimens collected. Recommendation:           - Patient has a contact number available for                            emergencies. The signs and symptoms of potential                            delayed complications were discussed with the  patient. Return to normal activities tomorrow.                            Written discharge instructions were provided to the                            patient.                           - Resume previous diet.                           - Continue present medications.                           - Repeat colonoscopy in 10 years for screening                            purposes. Yuette Putnam L. Myrtie Neither, MD 03/28/2023 11:45:28 AM This report has been signed electronically.

## 2023-03-28 NOTE — Progress Notes (Signed)
History and Physical:  This patient presents for endoscopic testing for: Encounter Diagnosis  Name Primary?   Personal history of colonic polyps Yes    No polyps last colonoscopy (Dr. Josefa Half) March 2018.  Reported adenoma on prior exam Patient denies chronic abdominal pain, chronic constipation or diarrhea.Had a single episode of painless rectal bleeding for which she saw PCP   Patient is otherwise without complaints or active issues today.   Past Medical History: Past Medical History:  Diagnosis Date   Acid reflux    Allergy    Anxiety    Asthma    Colon polyp    Duodenitis    Eczema    History of kidney stones    HNP (herniated nucleus pulposus), cervical    Hypercholesteremia    Prediabetes      Past Surgical History: Past Surgical History:  Procedure Laterality Date   BALLOON DILATION N/A 11/22/2016   Procedure: BALLOON DILATION;  Surgeon: Charolett Bumpers, MD;  Location: [facility];  Service: Endoscopy;  Laterality: N/A;   CESAREAN SECTION     x 3   COLONOSCOPY WITH PROPOFOL N/A 11/22/2016   Procedure: COLONOSCOPY WITH PROPOFOL;  Surgeon: Charolett Bumpers, MD;  Location: [facility];  Service: Endoscopy;  Laterality: N/A;   colonscopy     x 2   ESOPHAGOGASTRODUODENOSCOPY (EGD) WITH PROPOFOL N/A 11/22/2016   Procedure: ESOPHAGOGASTRODUODENOSCOPY (EGD) WITH PROPOFOL;  Surgeon: Charolett Bumpers, MD;  Location: [facility];  Service: Endoscopy;  Laterality: N/A;   LEFT HEART CATH AND CORONARY ANGIOGRAPHY N/A 08/01/2022   Procedure: LEFT HEART CATH AND CORONARY ANGIOGRAPHY;  Surgeon: Runell Gess, MD;  [facility]: [facility];  Service: Cardiovascular;  Laterality: N/A;   SAVORY DILATION N/A 11/22/2016   Procedure: SAVORY DILATION;  Surgeon: Charolett Bumpers, MD;  Location: [facility];  Service: Endoscopy;  Laterality: N/A;    Allergies: Allergies  Allergen Reactions   Repatha [Evolocumab]     rash   Celebrex [Celecoxib] Rash   Latex Rash     Outpatient Meds: Current Outpatient Medications  Medication Sig Dispense Refill   aspirin EC 81 MG tablet Take 81 mg by mouth daily. Swallow whole.     augmented betamethasone dipropionate (DIPROLENE-AF) 0.05 % ointment 1 application Externally twice a day for 14 days     Carboxymethylcell-Hypromellose (GENTEAL OP) Place 1 drop into both eyes at bedtime.     escitalopram (LEXAPRO) 10 MG tablet Take 15 mg by mouth daily. May increase to 10 mg after 7 days.     metFORMIN (GLUCOPHAGE-XR) 500 MG 24 hr tablet Take 1,000 mg by mouth every evening.     Rosuvastatin Calcium 40 MG CPSP Take 40 mg by mouth daily.     tretinoin (RETIN-A) 0.05 % cream APPLY EXTERNALLY TO THE AFFECTED AREA OF THE FACE IN THE EVENING ONCE DAILY for 20     Alirocumab (PRALUENT) 75 MG/ML SOAJ Inject 1 mL (75 mg total) into the skin every 14 (fourteen) days. 2 mL 11   ALPRAZolam (XANAX) 0.25 MG tablet Take 0.25 mg by mouth daily as needed for anxiety.     budesonide-formoterol (SYMBICORT) 160-4.5 MCG/ACT inhaler 2 puffs Inhalation Twice a day as needed     calcium carbonate (CALCIUM 600) 600 MG TABS tablet 1 tablet Orally once a day     cetirizine (ZYRTEC) 10 MG tablet Take 10 mg by mouth daily as needed for allergies.     cholecalciferol (VITAMIN D) 1000 units tablet Take  1,000 Units by mouth daily.     diphenhydrAMINE (BENADRYL ALLERGY) 25 mg capsule Take 1 capsule as needed by oral route.     fluocinonide cream (LIDEX) 0.05 % as directed Externally not for face or skin folds once daily for 2 weeks     nitroGLYCERIN (NITROSTAT) 0.4 MG SL tablet Place 0.4 mg under the tongue every 5 (five) minutes as needed for chest pain.     Current Facility-Administered Medications  Medication Dose Route Frequency Provider Last Rate Last Admin   0.9 %  sodium chloride infusion  500 mL Intravenous Continuous Danis, Starr Lake III, MD          ___________________________________________________________________ Objective    Exam:  BP 127/61   Pulse 61   Temp (!) 96.2 F (35.7 C)   Ht 5\' 9"  (1.753 m)   Wt 199 lb (90.3 kg)   SpO2 99%   BMI 29.39 kg/m   CV: regular , S1/S2 Resp: clear to auscultation bilaterally, normal RR and effort noted GI: soft, no tenderness, with active bowel sounds.   Assessment: Encounter Diagnosis  Name Primary?   Personal history of colonic polyps Yes     Plan: Colonoscopy   The benefits and risks of the planned procedure were described in detail with the patient or (when appropriate) their health care proxy.  Risks were outlined as including, but not limited to, bleeding, infection, perforation, adverse medication reaction leading to cardiac or pulmonary decompensation, pancreatitis (if ERCP).  The limitation of incomplete mucosal visualization was also discussed.  No guarantees or warranties were given.  The patient is appropriate for an endoscopic procedure in the ambulatory setting.   - Amada Jupiter, MD

## 2023-03-28 NOTE — Patient Instructions (Signed)
Please read handouts provided. Continue present medications. Repeat colonoscopy in 10 years for screening.   YOU HAD AN ENDOSCOPIC PROCEDURE TODAY AT THE [facility]:   Refer to the procedure report that was given to you for any specific questions about what was found during the examination.  If the procedure report does not answer your questions, please call your gastroenterologist to clarify.  If you requested that your care partner not be given the details of your procedure findings, then the procedure report has been included in a sealed envelope for you to review at your convenience later.  YOU SHOULD EXPECT: Some feelings of bloating in the abdomen. Passage of more gas than usual.  Walking can help get rid of the air that was put into your GI tract during the procedure and reduce the bloating. If you had a lower endoscopy (such as a colonoscopy or flexible sigmoidoscopy) you may notice spotting of blood in your stool or on the toilet paper. If you underwent a bowel prep for your procedure, you may not have a normal bowel movement for a few days.  Please Note:  You might notice some irritation and congestion in your nose or some drainage.  This is from the oxygen used during your procedure.  There is no need for concern and it should clear up in a day or so.  SYMPTOMS TO REPORT IMMEDIATELY:  Following lower endoscopy (colonoscopy or flexible sigmoidoscopy):  Excessive amounts of blood in the stool  Significant tenderness or worsening of abdominal pains  Swelling of the abdomen that is new, acute  Fever of 100F or higher.  For urgent or emergent issues, a gastroenterologist can be reached at any hour by calling [phone]. Do not use MyChart messaging for urgent concerns.    DIET:  We do recommend a small meal at first, but then you may proceed to your regular diet.  Drink plenty of fluids but you should avoid alcoholic beverages for 24 hours.  ACTIVITY:  You should  plan to take it easy for the rest of today and you should NOT DRIVE or use heavy machinery until tomorrow (because of the sedation medicines used during the test).    FOLLOW UP: Our staff will call the number listed on your records the next business day following your procedure.  We will call around 7:15- 8:00 am to check on you and address any questions or concerns that you may have regarding the information given to you following your procedure. If we do not reach you, we will leave a message.     If any biopsies were taken you will be contacted by phone or by letter within the next 1-3 weeks.  Please call us at [phone] if you have not heard about the biopsies in 3 weeks.    SIGNATURES/CONFIDENTIALITY: You and/or your care partner have signed paperwork which will be entered into your electronic medical record.  These signatures attest to the fact that that the information above on your After Visit Summary has been reviewed and is understood.  Full responsibility of the confidentiality of this discharge information lies with you and/or your care-partner. 

## 2023-03-28 NOTE — Progress Notes (Signed)
Vss nad trans to pacu 

## 2023-03-29 ENCOUNTER — Telehealth: Payer: Self-pay

## 2023-03-29 NOTE — Telephone Encounter (Signed)
  Follow up Call-     03/28/2023   10:46 AM  Call back number  Post procedure Call Back phone  # [phone]  Permission to leave phone message Yes     Patient questions:  Do you have a fever, pain , or abdominal swelling? No. Pain Score  0 *  Have you tolerated food without any problems? Yes.    Have you been able to return to your normal activities? Yes.    Do you have any questions about your discharge instructions: Diet   No. Medications  No. Follow up visit  No.  Do you have questions or concerns about your Care? No.  Actions: * If pain score is 4 or above: No action needed, pain <4.

## 2023-03-30 ENCOUNTER — Encounter: Payer: Self-pay | Admitting: Cardiology

## 2023-04-14 ENCOUNTER — Encounter: Payer: PPO | Admitting: Dietician

## 2023-04-24 DIAGNOSIS — E785 Hyperlipidemia, unspecified: Secondary | ICD-10-CM | POA: Diagnosis not present

## 2023-04-24 DIAGNOSIS — R7303 Prediabetes: Secondary | ICD-10-CM | POA: Diagnosis not present

## 2023-04-24 DIAGNOSIS — Z6829 Body mass index (BMI) 29.0-29.9, adult: Secondary | ICD-10-CM | POA: Diagnosis not present

## 2023-04-24 DIAGNOSIS — E663 Overweight: Secondary | ICD-10-CM | POA: Diagnosis not present

## 2023-04-24 DIAGNOSIS — F419 Anxiety disorder, unspecified: Secondary | ICD-10-CM | POA: Diagnosis not present

## 2023-04-27 ENCOUNTER — Encounter: Payer: PPO | Attending: Family Medicine | Admitting: Dietician

## 2023-04-27 ENCOUNTER — Encounter: Payer: Self-pay | Admitting: Dietician

## 2023-04-27 VITALS — Wt 206.0 lb

## 2023-04-27 DIAGNOSIS — R7303 Prediabetes: Secondary | ICD-10-CM | POA: Insufficient documentation

## 2023-04-27 NOTE — Progress Notes (Signed)
Patient was seen on 04/27/23 for the Core Session 1 of Diabetes Prevention Program course at [facility]. The following learning objectives were met by the patient during this class:   Learning Objectives:  Be able to explain the purpose and benefits of the National Diabetes Prevention Program.  Be able to describe the events that will take place at every session.  Know the weight loss and physical activity goals established by the [facility] Diabetes Prevention Program.  Know their own individual weight loss and physical activity goals.  Be able to explain the important effect of self-monitoring on behavior change.   Goals:  Record food and beverage intake in "Food and Activity Tracker" over the next week.  Bring completed "Food and Activity Tracker" for session 1 to session 2 next week. Circle the foods or beverages you think are highest in fat and calories in your food tracker. Read the labels on the food you buy, and consider using measuring cups and spoons to help you calculate the amount you eat. We will talk about measuring in more detail in the coming weeks.   Follow-Up Plan: Attend Core Session 2 next week.  Bring completed "Food and Activity Tracker" next week to be reviewed by Lifestyle [facility].

## 2023-04-28 ENCOUNTER — Encounter: Payer: Self-pay | Admitting: Dietician

## 2023-04-28 ENCOUNTER — Encounter: Payer: PPO | Admitting: Dietician

## 2023-04-28 DIAGNOSIS — R7303 Prediabetes: Secondary | ICD-10-CM

## 2023-04-28 NOTE — Progress Notes (Signed)
Patient was seen on 04/28/23 for the Core Session 2 of Diabetes Prevention Program course at [facility]. By the end of this session patients are able to complete the following objectives:   Learning Objectives: Self-monitor their weight during the weeks following Session 2.  Describe the relationship between fat and calories.  Explain the reason for, and basic principles of, self-monitoring fat grams and calories.  Identify their personal fat gram goals.  Use the ?Fat and Calorie Counter? to calculate the calories and fat grams of a given selection of foods.  Keep a running total of the fat grams they eat each day.  Calculate fat, calories, and serving sizes from nutrition labels.   Goals:  Weigh yourself at the same time each day, or every few days, and record your weight in your Food and Activity Tracker. Write down everything you eat and drink in your Food and Activity Tracker. Measure portions as much as you can, and start reading labels.  Use the ?Fat and Calorie Counter? to figure out the amount of fat and calories in what you ate, and write the amount down in your Food and Activity Tracker. Keep a running fat gram total throughout the day. Come as close to your fat gram goal as you can.   Follow-Up Plan: Attend Core Session 3 next week.  Bring completed "Food and Activity Tracker" next week to be reviewed by Lifestyle Coach.

## 2023-05-05 ENCOUNTER — Encounter: Payer: PPO | Admitting: Dietician

## 2023-05-05 ENCOUNTER — Ambulatory Visit: Payer: PPO | Admitting: Nurse Practitioner

## 2023-05-10 DIAGNOSIS — L821 Other seborrheic keratosis: Secondary | ICD-10-CM | POA: Diagnosis not present

## 2023-05-10 DIAGNOSIS — L578 Other skin changes due to chronic exposure to nonionizing radiation: Secondary | ICD-10-CM | POA: Diagnosis not present

## 2023-05-10 DIAGNOSIS — L309 Dermatitis, unspecified: Secondary | ICD-10-CM | POA: Diagnosis not present

## 2023-05-10 DIAGNOSIS — D225 Melanocytic nevi of trunk: Secondary | ICD-10-CM | POA: Diagnosis not present

## 2023-05-10 DIAGNOSIS — L853 Xerosis cutis: Secondary | ICD-10-CM | POA: Diagnosis not present

## 2023-05-10 DIAGNOSIS — L814 Other melanin hyperpigmentation: Secondary | ICD-10-CM | POA: Diagnosis not present

## 2023-05-11 ENCOUNTER — Encounter: Payer: PPO | Admitting: Dietician

## 2023-05-11 ENCOUNTER — Telehealth: Payer: Self-pay

## 2023-05-11 ENCOUNTER — Encounter: Payer: Self-pay | Admitting: Dietician

## 2023-05-11 DIAGNOSIS — R7303 Prediabetes: Secondary | ICD-10-CM

## 2023-05-11 DIAGNOSIS — E782 Mixed hyperlipidemia: Secondary | ICD-10-CM

## 2023-05-11 NOTE — Telephone Encounter (Signed)
Patient in lab today.  States she was to have a lab drawn.  Spoke with pharmacy and Lipid is needed since patient was on Repatha and now Praluent.  Lipid ordered.  Lab aware and will be draw by Lab tech

## 2023-05-11 NOTE — Progress Notes (Signed)
Patient was seen on 05/11/23 for the Core Session 3 of Diabetes Prevention Program course at [facility]. By the end of this session patients are able to complete the following objectives:   Learning Objectives: Weigh and measure foods. Estimate the fat and calorie content of common foods. Describe three ways to eat less fat and fewer calories. Create a plan to eat less fat for the following week.   Goals:  Track weight when weighing outside of class.  Track food and beverages eaten each day in Food and Activity Tracker and include fat grams and calories for each.  Try to stay within fat gram goal.  Complete plan for eating less high fat foods and answer related homework questions.    Follow-Up Plan: Attend Core Session 4 next week.  Bring completed "Food and Activity Tracker" next week to be reviewed by Lifestyle Coach.

## 2023-05-12 LAB — LIPID PANEL
Chol/HDL Ratio: 1.7 ratio (ref 0.0–4.4)
Cholesterol, Total: 104 mg/dL (ref 100–199)
HDL: 63 mg/dL (ref >39)
LDL Chol Calc (NIH): 29 mg/dL (ref 0–99)
Triglycerides: 49 mg/dL (ref 0–149)
VLDL Cholesterol Cal: 12 mg/dL (ref 5–40)

## 2023-05-12 NOTE — Progress Notes (Unsigned)
Cardiology Office Note:  .   Date:  05/16/2023  ID:  FAIGE DENBOW, DOB [birth year], MRN [id number] PCP: Gweneth Dimitri, MD  [facility] Providers Cardiologist:  [name], MD    Patient Profile: .      PMH CAD Coronary calcium score 1252 (99th percentile) on CT 07/11/22 with heavy distribution in LAD and LCx Cardiac catheterization 08/01/22  Heavy calcification in LAD and LCx but no obstructive disease Normal LV function, normal LVEDP Hyperlipidemia Family history CAD Mother died from CHF age 66, uncertain etiology First cousin had MI in her 56s Elevated lipoprotein a  Asthma  Initially seen by Dr. Shirlee Latch 06/2022. She had a Cardiolite 04/2010 that was normal.  She self-referred due to elevated coronary calcium score.  She reported very active with tennis 2-3 times per week, walking, works out at [employer] with yoga and aerobics and also golfs.  She had excellent exercise tolerance at the time.  Family history significant for mother with CHF of uncertain etiology, 2 first cousins on her mother side who had MI in her 25s.  She had been on Crestor for years.  Following the CT calcium score, her PCP increased Crestor from 40 mg daily to 40 mg daily.  She had also started aspirin.  Plan for cardiac PET scan given very high calcium score.  Unfortunately, she developed chest pain and was hospitalized and underwent cath on 08/01/2022.  Cath revealed nonobstructive disease with a EF 55 to 60%.  She was found to have elevated LP(a) at 191.  She was started on Repatha in addition to Crestor and aspirin.  Seen by Dr. Shari Prows 11/02/2022 at which time she was feeling well.  She had been tolerating medications without any concerns.  She was working on improving her diet and weight loss.  She contacted [place] in June to report an allergic skin reaction to Repatha.  She was switched to Praluent.       History of Present Illness: .   NIXIE JOKINEN is a very pleasant 67 y.o. female who is here  today for follow-up. She reports she is feeling well. Chest pain that led to cath was burning sensation in the middle of her chest that radiated down both arms. She has had no further episodes. Feels that it was likely anxiety-induced as she has felt unsettled since the initial diagnosis of high calcium score. She remains active with tennis, golf, yoga, and walking and has no concerning cardiac symptoms. She denies dyspnea, orthopnea, PND, presyncope, syncope, edema. She had a list of questions which we addressed to her satisfaction.   ROS: See HPI       Studies Reviewed: .        Risk Assessment/Calculations:             Physical Exam:   VS:  BP 112/72   Pulse 64   Ht 5\' 9"  (1.753 m)   Wt 206 lb (93.4 kg)   BMI 30.42 kg/m    Wt Readings from Last 3 Encounters:  05/16/23 206 lb (93.4 kg)  04/27/23 206 lb (93.4 kg)  03/28/23 199 lb (90.3 kg)    GEN: Well nourished, well developed in no acute distress NECK: No JVD; No carotid bruits CARDIAC: RRR, no murmurs, rubs, gallops RESPIRATORY:  Clear to auscultation without rales, wheezing or rhonchi  ABDOMEN: Soft, non-tender, non-distended EXTREMITIES:  No edema; No deformity     ASSESSMENT AND PLAN: .    CAD without angina: Heavy calcification  of LAD and LCx but no obstructive coronary disease on cath 07/2022. She denies chest pain, dyspnea, or other symptoms concerning for angina.  No indication for further ischemic evaluation at this time. We discussed concerning symptoms to report in the future. Admits she has been very anxious about the elevated calcium score. Feels improvement since starting Lexapro. Continue to work on secondary prevention including heart healthy diet avoiding saturated fat, processed foods, sugar, and simple carbohydrates, and aim for 150 minutes moderate intensity exercise each week.  No bleeding concerns on aspirin.  Continue aspirin, Praluent, rosuvastatin.   Hyperlipidemia LDL goal < 55/Elevated Lp(a): LDL 29 on  05/11/2023, well controlled. Tolerating Praluent without concerning side effects. Lp(a) 191.4 on 07/14/22. She is aware that there are no agents to directly target this molecule but she is on PCSK9i, which provides limited benefit. As noted above, continue healthy diet and regular physical activity.    Type 2 DM: A1C 6.0 on 01/17/23. Continue metformin. Management per PCP.        Dispo: 6 months with Dr. Cristal Deer (transfer from Dr. Shari Prows)  Signed, Eligha Bridegroom, NP-C

## 2023-05-16 ENCOUNTER — Encounter: Payer: Self-pay | Admitting: Nurse Practitioner

## 2023-05-16 ENCOUNTER — Ambulatory Visit: Payer: PPO | Attending: Cardiology | Admitting: Nurse Practitioner

## 2023-05-16 VITALS — BP 112/72 | HR 64 | Ht 69.0 in | Wt 206.0 lb

## 2023-05-16 DIAGNOSIS — I2510 Atherosclerotic heart disease of native coronary artery without angina pectoris: Secondary | ICD-10-CM | POA: Diagnosis not present

## 2023-05-16 DIAGNOSIS — E119 Type 2 diabetes mellitus without complications: Secondary | ICD-10-CM | POA: Diagnosis not present

## 2023-05-16 DIAGNOSIS — E785 Hyperlipidemia, unspecified: Secondary | ICD-10-CM | POA: Diagnosis not present

## 2023-05-16 DIAGNOSIS — R931 Abnormal findings on diagnostic imaging of heart and coronary circulation: Secondary | ICD-10-CM | POA: Diagnosis not present

## 2023-05-16 NOTE — Patient Instructions (Signed)
Medication Instructions:   Your physician recommends that you continue on your current medications as directed. Please refer to the Current Medication list given to you today.   *If you need a refill on your cardiac medications before your next appointment, please call your pharmacy*   Lab Work:  None ordered.  If you have labs (blood work) drawn today and your tests are completely normal, you will receive your results only by: MyChart Message (if you have MyChart) OR A paper copy in the mail If you have any lab test that is abnormal or we need to change your treatment, we will call you to review the results.   Testing/Procedures:  None ordered.   Follow-Up: At [facility], you and your health needs are our priority.  As part of our continuing mission to provide you with exceptional heart care, we have created designated Provider Care Teams.  These Care Teams include your primary Cardiologist (physician) and Advanced Practice Providers (APPs -  Physician Assistants and Nurse Practitioners) who all work together to provide you with the care you need, when you need it.  We recommend signing up for the patient portal called "MyChart".  Sign up information is provided on this After Visit Summary.  MyChart is used to connect with patients for Virtual Visits (Telemedicine).  Patients are able to view lab/test results, encounter notes, upcoming appointments, etc.  Non-urgent messages can be sent to your provider as well.   To learn more about what you can do with MyChart, go to [email or web address].    Your next appointment:   6 month(s)  Provider:   Jodelle Red, MD

## 2023-05-18 NOTE — Progress Notes (Deleted)
[facility] [address] [place] [address] Phone: [phone] Subjective:    I'm seeing this patient by the request  of:  Gweneth Dimitri, MD  CC:   [id number]  02/23/2023 Arthritis of the [facility] joint noted. Discussed icing regimen and home exercises, which activities to do and which ones to avoid. Discussed wearing gloves at night. Follow-up again in 6 to 8 weeks  Wrist tendinitis seems to be doing well overall. Discussed with patient about icing regimen and home exercises, discussed which activities to do and which ones to avoid. Increase activity slowly over the course of next several weeks. Discussed continuing the bracing at night as well as taping with activities. Follow-up in 2 to 3 months if not perfect   Updated 05/26/2023 A[facility] is a 67 y.o. female coming in with complaint of R wrist pain      Past Medical History:  Diagnosis Date   Acid reflux    Allergy    Anxiety    Asthma    Colon polyp    Duodenitis    Eczema    History of kidney stones    HNP (herniated nucleus pulposus), cervical    Hypercholesteremia    Prediabetes    Past Surgical History:  Procedure Laterality Date   BALLOON DILATION N/A 11/22/2016   Procedure: BALLOON DILATION;  Surgeon: Charolett Bumpers, MD;  Location: [facility];  Service: Endoscopy;  Laterality: N/A;   CESAREAN SECTION     x 3   COLONOSCOPY WITH PROPOFOL N/A 11/22/2016   Procedure: COLONOSCOPY WITH PROPOFOL;  Surgeon: Charolett Bumpers, MD;  Location: [facility];  Service: Endoscopy;  Laterality: N/A;   colonscopy     x 2   ESOPHAGOGASTRODUODENOSCOPY (EGD) WITH PROPOFOL N/A 11/22/2016   Procedure: ESOPHAGOGASTRODUODENOSCOPY (EGD) WITH PROPOFOL;  Surgeon: Charolett Bumpers, MD;  Location: [facility];  Service: Endoscopy;  Laterality: N/A;   LEFT HEART CATH AND CORONARY ANGIOGRAPHY N/A 08/01/2022   Procedure: LEFT HEART CATH AND CORONARY ANGIOGRAPHY;  Surgeon: Runell Gess, MD;   Location: [facility];  Service: Cardiovascular;  Laterality: N/A;   SAVORY DILATION N/A 11/22/2016   Procedure: SAVORY DILATION;  Surgeon: Charolett Bumpers, MD;  Location: [facility];  Service: Endoscopy;  Laterality: N/A;   Social History   Socioeconomic History   Marital status: Married    Spouse name: Not on file   Number of children: 3   Years of education: B.S.   Highest education level: Not on file  Occupational History   Occupation: housewife  Tobacco Use   Smoking status: Never   Smokeless tobacco: Never  Vaping Use   Vaping status: Never Used  Substance and Sexual Activity   Alcohol use: Yes    Alcohol/week: 5.0 - 6.0 standard drinks of alcohol    Types: 5 - 6 Standard drinks or equivalent per week    Comment: weekends only 3-4 glasses   Drug use: No   Sexual activity: Not on file  Other Topics Concern   Not on file  Social History Narrative   Patient does not drink caffeine.   Patient is right handed.   Social Determinants of Health   Financial Resource Strain: Not on file  Food Insecurity: No Food Insecurity (07/31/2022)   Hunger Vital Sign    Worried About Running Out of Food in the Last Year: Never true    Ran Out of Food in the Last Year: Never true  Transportation Needs: No Transportation Needs (07/31/2022)   PRAPARE - Administrator, Civil Service (Medical): No    Lack of Transportation (Non-Medical): No  Physical Activity: Not on file  Stress: Not on file  Social Connections: Not on file   Allergies  Allergen Reactions   Repatha [Evolocumab]     rash   Celebrex [Celecoxib] Rash   Latex Rash   Family History  Problem Relation Age of Onset   Cancer Mother    Heart disease Mother    Breast cancer Mother    Cancer Father    Colon cancer Paternal Aunt        48s   Breast cancer Paternal Aunt    Heart attack Cousin    Colon polyps Neg Hx    Esophageal cancer Neg Hx    Rectal cancer Neg Hx    Stomach cancer Neg Hx      Current Outpatient Medications (Endocrine & Metabolic):    metFORMIN (GLUCOPHAGE-XR) 500 MG 24 hr tablet, Take 1,000 mg by mouth every evening.  Current Outpatient Medications (Cardiovascular):    Alirocumab (PRALUENT) 75 MG/ML SOAJ, Inject 1 mL (75 mg total) into the skin every 14 (fourteen) days.   nitroGLYCERIN (NITROSTAT) 0.4 MG SL tablet, Place 0.4 mg under the tongue every 5 (five) minutes as needed for chest pain.   Rosuvastatin Calcium 40 MG CPSP, Take 40 mg by mouth daily.  Current Outpatient Medications (Respiratory):    budesonide-formoterol (SYMBICORT) 160-4.5 MCG/ACT inhaler, 2 puffs Inhalation Twice a day as needed   cetirizine (ZYRTEC) 10 MG tablet, Take 10 mg by mouth daily as needed for allergies.   diphenhydrAMINE (BENADRYL ALLERGY) 25 mg capsule, Take 1 capsule as needed by oral route.  Current Outpatient Medications (Analgesics):    aspirin EC 81 MG tablet, Take 81 mg by mouth daily. Swallow whole.   Current Outpatient Medications (Other):    ALPRAZolam (XANAX) 0.25 MG tablet, Take 0.25 mg by mouth daily as needed for anxiety.   calcium carbonate (CALCIUM 600) 600 MG TABS tablet, 1 tablet Orally once a day   Carboxymethylcell-Hypromellose (GENTEAL OP), Place 1 drop into both eyes at bedtime.   cholecalciferol (VITAMIN D) 1000 units tablet, Take 1,000 Units by mouth daily.   escitalopram (LEXAPRO) 10 MG tablet, Take 15 mg by mouth daily. May increase to 10 mg after 7 days.   fluocinonide cream (LIDEX) 0.05 %, as directed Externally not for face or skin folds once daily for 2 weeks   tretinoin (RETIN-A) 0.05 % cream, APPLY EXTERNALLY TO THE AFFECTED AREA OF THE FACE IN THE EVENING ONCE DAILY for 20   Reviewed prior external information including notes and imaging from  primary care provider As well as notes that were available from care everywhere and other healthcare systems.  Past medical history, social, surgical and family history all reviewed in electronic  medical record.  No pertanent information unless stated regarding to the chief complaint.   Review of Systems:  No headache, visual changes, nausea, vomiting, diarrhea, constipation, dizziness, abdominal pain, skin rash, fevers, chills, night sweats, weight loss, swollen lymph nodes, body aches, joint swelling, chest pain, shortness of breath, mood changes. POSITIVE muscle aches  Objective  There were no vitals taken for this visit.   General: No apparent distress alert and oriented x3 mood and affect normal, dressed appropriately.  HEENT: Pupils equal, extraocular movements intact  Respiratory: Patient's speak in full sentences and does not appear short of breath  Cardiovascular: No  lower extremity edema, non tender, no erythema      Impression and Recommendations:

## 2023-05-19 ENCOUNTER — Encounter: Payer: PPO | Admitting: Dietician

## 2023-05-19 ENCOUNTER — Encounter: Payer: Self-pay | Admitting: Dietician

## 2023-05-19 DIAGNOSIS — R7303 Prediabetes: Secondary | ICD-10-CM

## 2023-05-19 NOTE — Progress Notes (Signed)
Patient was seen on 05/19/23 for the Core Session 4 of Diabetes Prevention Program course at [facility]. By the end of this session patients are able to complete the following objectives:   Learning Objectives: Explain the health benefits of eating less fat and fewer calories. Describe the MyPlate food guide and its recommendations, including how to reduce fat and calories in our diet. Compare and contrast MyPlate guidelines with participants' eating habits. List ways to replace high-fat and high-calorie foods with low-fat and low-calorie foods. Explain the importance of eating plenty of whole grains, vegetables, and fruits, while staying within fat gram goals. Explain the importance of eating foods from all groups of MyPlate and of eating a variety of foods from within each group. Explain why a balanced diet is beneficial to health. Explain why eating the same foods over and over is not the best strategy for long-term success.   Goals:  Record weight taken outside of class.  Track foods and beverages eaten each day in the "Food and Activity Tracker," including calories and fat grams for each item.  Practice comparing what you eat with the recommendations of MyPlate using the "Rate Your Plate" handout.  Complete the "Rate Your Plate" handout form on at least 3 days.  Answer homework questions.   Follow-Up Plan: Attend Core Session 5 next week.  Bring completed "Food and Activity Tracker" next week to be reviewed by Lifestyle Coach.

## 2023-05-26 ENCOUNTER — Ambulatory Visit: Payer: PPO | Admitting: Family Medicine

## 2023-05-26 ENCOUNTER — Encounter: Payer: PPO | Attending: Family Medicine | Admitting: Dietician

## 2023-05-26 DIAGNOSIS — R7303 Prediabetes: Secondary | ICD-10-CM | POA: Insufficient documentation

## 2023-06-05 DIAGNOSIS — F419 Anxiety disorder, unspecified: Secondary | ICD-10-CM | POA: Diagnosis not present

## 2023-06-05 DIAGNOSIS — E669 Obesity, unspecified: Secondary | ICD-10-CM | POA: Diagnosis not present

## 2023-06-05 DIAGNOSIS — Z6830 Body mass index (BMI) 30.0-30.9, adult: Secondary | ICD-10-CM | POA: Diagnosis not present

## 2023-06-05 DIAGNOSIS — E785 Hyperlipidemia, unspecified: Secondary | ICD-10-CM | POA: Diagnosis not present

## 2023-06-05 DIAGNOSIS — R7303 Prediabetes: Secondary | ICD-10-CM | POA: Diagnosis not present

## 2023-06-07 ENCOUNTER — Ambulatory Visit: Payer: PPO | Admitting: Cardiology

## 2023-06-09 ENCOUNTER — Encounter: Payer: Self-pay | Admitting: Dietician

## 2023-06-09 ENCOUNTER — Encounter: Payer: PPO | Admitting: Dietician

## 2023-06-09 DIAGNOSIS — R7303 Prediabetes: Secondary | ICD-10-CM

## 2023-06-09 NOTE — Progress Notes (Signed)
Patient was seen on 06/09/23 for the Core Session 6 of Diabetes Prevention Program course at [facility]. By the end of this session patients are able to complete the following objectives:   Learning Objectives: Graph their daily physical activity.  Describe two ways of finding the time to be active.  Define "lifestyle activity."  Describe how to prevent injury.  Develop an activity plan for the coming week.   Goals:  Record weight taken outside of class.  Track foods and beverages eaten each day in the "Food and Activity Tracker," including calories and fat grams for each item.   Track activity type, minutes you were active, and distance you reached each day in the "Food and Activity Tracker."  Set aside one 20 to 30-minute block of time every day or find two or more periods of 10 to15 minutes each for physical activity.  Warm up, cool down, and stretch. Make a Physical Activities Plan for the Week.   Follow-Up Plan: Attend Core Session 7 next week.  Bring completed "Food and Activity Tracker" next week to be reviewed by Lifestyle Coach.

## 2023-06-14 ENCOUNTER — Encounter: Payer: PPO | Admitting: Dietician

## 2023-06-14 ENCOUNTER — Encounter: Payer: Self-pay | Admitting: Dietician

## 2023-06-14 DIAGNOSIS — R7303 Prediabetes: Secondary | ICD-10-CM

## 2023-06-14 NOTE — Progress Notes (Signed)
Patient was seen on 06/14/23 for the Core Session 7 of Diabetes Prevention Program course at [facility]. By the end of this session patients are able to complete the following objectives:   Learning Objectives: Define calorie balance. Explain how healthy eating and being active are related in terms of calorie balance.  Describe the relationship between calorie balance and weight loss.  Describe his or her progress as it relates to calorie balance.  Develop an activity plan for the coming week.   Goals:  Record weight taken outside of class.  Track foods and beverages eaten each day in the "Food and Activity Tracker," including calories and fat grams for each item.   Track activity type, minutes you were active, and distance you reached each day in the "Food and Activity Tracker."  Set aside one 20 to 30-minute block of time every day or find two or more periods of 10 to15 minutes each for physical activity.  Make a Physical Activities Plan for the Week.  Make active lifestyle choices all through the day  Stay at or go slightly over activity goal.   Follow-Up Plan: Attend Core Session 8 next week.  Bring completed "Food and Activity Tracker" next week to be reviewed by Lifestyle Coach.

## 2023-06-16 ENCOUNTER — Encounter: Payer: PPO | Admitting: Dietician

## 2023-06-23 ENCOUNTER — Encounter: Payer: PPO | Admitting: Dietician

## 2023-06-26 DIAGNOSIS — L089 Local infection of the skin and subcutaneous tissue, unspecified: Secondary | ICD-10-CM | POA: Diagnosis not present

## 2023-06-26 DIAGNOSIS — S60511A Abrasion of right hand, initial encounter: Secondary | ICD-10-CM | POA: Diagnosis not present

## 2023-06-27 DIAGNOSIS — H2513 Age-related nuclear cataract, bilateral: Secondary | ICD-10-CM | POA: Diagnosis not present

## 2023-06-27 DIAGNOSIS — H43813 Vitreous degeneration, bilateral: Secondary | ICD-10-CM | POA: Diagnosis not present

## 2023-06-27 DIAGNOSIS — H35421 Microcystoid degeneration of retina, right eye: Secondary | ICD-10-CM | POA: Diagnosis not present

## 2023-07-14 ENCOUNTER — Encounter: Payer: PPO | Attending: Dietician | Admitting: Dietician

## 2023-07-14 ENCOUNTER — Encounter: Payer: Self-pay | Admitting: Dietician

## 2023-07-14 DIAGNOSIS — R7303 Prediabetes: Secondary | ICD-10-CM | POA: Insufficient documentation

## 2023-07-14 NOTE — Progress Notes (Signed)
Patient was seen on 07/14/23 for the Core Session 10 of Diabetes Prevention Program course at [facility]. By the end of this session patients are able to complete the following objectives:   Learning Objectives: List and describe the four keys for healthy eating out.  Give examples of how to apply these keys at the type of restaurants that the participants go to regularly.  Make an appropriate meal selection from a restaurant menu.  Demonstrate how to ask for a substitute item using assertive language and a polite tone of voice.    Goals:  Record weight taken outside of class.  Track foods and beverages eaten each day in the "Food and Activity Tracker," including calories and fat grams for each item.   Track activity type, minutes you were active, and distance you reached each day in the "Food and Activity Tracker."  Set aside one 20 to 30-minute block of time every day or find two or more periods of 10 to15 minutes each for physical activity.  Utilize positive action plan and complete questions on "To Do List."   Follow-Up Plan: Attend Core Session 11 next week.  Bring completed "Food and Activity Tracker" next week to be reviewed by Lifestyle Coach.

## 2023-07-17 DIAGNOSIS — E669 Obesity, unspecified: Secondary | ICD-10-CM | POA: Diagnosis not present

## 2023-07-17 DIAGNOSIS — R7303 Prediabetes: Secondary | ICD-10-CM | POA: Diagnosis not present

## 2023-07-17 DIAGNOSIS — F419 Anxiety disorder, unspecified: Secondary | ICD-10-CM | POA: Diagnosis not present

## 2023-07-17 DIAGNOSIS — Z6831 Body mass index (BMI) 31.0-31.9, adult: Secondary | ICD-10-CM | POA: Diagnosis not present

## 2023-07-17 DIAGNOSIS — E785 Hyperlipidemia, unspecified: Secondary | ICD-10-CM | POA: Diagnosis not present

## 2023-07-21 ENCOUNTER — Encounter: Payer: Self-pay | Admitting: Dietician

## 2023-07-21 ENCOUNTER — Encounter: Payer: PPO | Attending: Family Medicine | Admitting: Dietician

## 2023-07-21 DIAGNOSIS — R7303 Prediabetes: Secondary | ICD-10-CM | POA: Insufficient documentation

## 2023-07-21 NOTE — Progress Notes (Signed)
On 07/21/23 completed Session 11 of Diabetes Prevention Program course  with [facility]. By the end of this session patients are able to complete the following objectives:   Learning Objectives: Give examples of negative thoughts that could prevent them from meeting their goals of losing weight and being more physically active.  Describe how to stop negative thoughts and talk back to them with positive thoughts.  Practice 1) stopping negative thoughts and 2) talking back to negative thoughts with positive ones.    Goals:  Record weight taken outside of class.  Track foods and beverages eaten each day in the "Food and Activity Tracker," including calories and fat grams for each item.   Track activity type, minutes you were active, and distance you reached each day in the "Food and Activity Tracker."  If you have any negative thoughts-write them in your Food and Activity Trackers, along with how you talked back to them. Practice stopping negative thoughts and talking back to them with positive thoughts.   Follow-Up Plan: Attend Core Session 12 next week.  Bring completed "Food and Activity Tracker" next week to be reviewed by Lifestyle Coach.

## 2023-07-27 DIAGNOSIS — R7303 Prediabetes: Secondary | ICD-10-CM | POA: Diagnosis not present

## 2023-07-28 ENCOUNTER — Encounter: Payer: PPO | Admitting: Dietician

## 2023-08-04 ENCOUNTER — Encounter: Payer: Self-pay | Admitting: Dietician

## 2023-08-04 ENCOUNTER — Encounter: Payer: PPO | Admitting: Dietician

## 2023-08-04 DIAGNOSIS — R7303 Prediabetes: Secondary | ICD-10-CM

## 2023-08-04 NOTE — Progress Notes (Signed)
Patient was seen on 08/04/23 for the Core Session 12 of Diabetes Prevention Program course at [facility]. By the end of this session patients are able to complete the following objectives:   Learning Objectives: Describe their current progress toward defined goals. Describe common causes for slipping from healthy eating or being active. Explain what to do to get back on their feet after a slip.  Goals:  Record weight taken outside of class.  Track foods and beverages eaten each day in the "Food and Activity Tracker," including calories and fat grams for each item.   Track activity type, minutes active, and distance reached each day in the "Food and Activity Tracker."  Try out the two action plans created during session- "Slips from Healthy Eating: Action Plan" and "Slips from Being Active: Action Plan" Answer questions on the handout.   Follow-Up Plan: Attend Core Session 13 next week.  Bring completed "Food and Activity Tracker" next week to be reviewed by Lifestyle Coach.

## 2023-08-10 DIAGNOSIS — I2510 Atherosclerotic heart disease of native coronary artery without angina pectoris: Secondary | ICD-10-CM | POA: Diagnosis not present

## 2023-08-10 DIAGNOSIS — F419 Anxiety disorder, unspecified: Secondary | ICD-10-CM | POA: Diagnosis not present

## 2023-08-10 DIAGNOSIS — J45909 Unspecified asthma, uncomplicated: Secondary | ICD-10-CM | POA: Diagnosis not present

## 2023-08-10 DIAGNOSIS — J410 Simple chronic bronchitis: Secondary | ICD-10-CM | POA: Diagnosis not present

## 2023-08-10 DIAGNOSIS — E785 Hyperlipidemia, unspecified: Secondary | ICD-10-CM | POA: Diagnosis not present

## 2023-08-10 DIAGNOSIS — Z7982 Long term (current) use of aspirin: Secondary | ICD-10-CM | POA: Diagnosis not present

## 2023-08-10 DIAGNOSIS — G8929 Other chronic pain: Secondary | ICD-10-CM | POA: Diagnosis not present

## 2023-08-10 DIAGNOSIS — R7303 Prediabetes: Secondary | ICD-10-CM | POA: Diagnosis not present

## 2023-08-10 DIAGNOSIS — H04129 Dry eye syndrome of unspecified lacrimal gland: Secondary | ICD-10-CM | POA: Diagnosis not present

## 2023-08-10 DIAGNOSIS — K200 Eosinophilic esophagitis: Secondary | ICD-10-CM | POA: Diagnosis not present

## 2023-08-10 DIAGNOSIS — E669 Obesity, unspecified: Secondary | ICD-10-CM | POA: Diagnosis not present

## 2023-08-10 DIAGNOSIS — L309 Dermatitis, unspecified: Secondary | ICD-10-CM | POA: Diagnosis not present

## 2023-08-11 ENCOUNTER — Encounter: Payer: Self-pay | Admitting: Dietician

## 2023-08-11 ENCOUNTER — Encounter: Payer: PPO | Admitting: Dietician

## 2023-08-11 DIAGNOSIS — N63 Unspecified lump in unspecified breast: Secondary | ICD-10-CM | POA: Diagnosis not present

## 2023-08-11 DIAGNOSIS — R7303 Prediabetes: Secondary | ICD-10-CM

## 2023-08-11 NOTE — Progress Notes (Signed)
Patient was seen on 08/11/2023 for the Core Session 13 of Diabetes Prevention Program course at [facility]. By the end of this session patients are able to complete the following objectives:   Learning Objectives: Describe ways to add interest and variety to their activity plans. Define ?aerobic fitness. Explain the four F.I.T.T. principles (frequency, intensity, time, and type of activity) and how they relate to aerobic fitness.   Goals:  Record weight taken outside of class.  Track foods and beverages eaten each day in the "Food and Activity Tracker," including calories and fat grams for each item.   Track activity type, minutes you were active, and distance you reached each day in the "Food and Activity Tracker."  Do your best to reach activity goal for the week. Use one of the F.I.T.T. principles to jump start workouts. Document activity level on the "To Do Next Week" handout.  Follow-Up Plan: Attend Core Session 14 next week.  Bring completed "Food and Activity Tracker" next week to be reviewed by Lifestyle Coach.

## 2023-08-21 DIAGNOSIS — M778 Other enthesopathies, not elsewhere classified: Secondary | ICD-10-CM | POA: Diagnosis not present

## 2023-08-21 DIAGNOSIS — Z6831 Body mass index (BMI) 31.0-31.9, adult: Secondary | ICD-10-CM | POA: Diagnosis not present

## 2023-08-21 DIAGNOSIS — I2510 Atherosclerotic heart disease of native coronary artery without angina pectoris: Secondary | ICD-10-CM | POA: Diagnosis not present

## 2023-08-21 DIAGNOSIS — L089 Local infection of the skin and subcutaneous tissue, unspecified: Secondary | ICD-10-CM | POA: Diagnosis not present

## 2023-08-21 DIAGNOSIS — J45909 Unspecified asthma, uncomplicated: Secondary | ICD-10-CM | POA: Diagnosis not present

## 2023-08-21 DIAGNOSIS — R7303 Prediabetes: Secondary | ICD-10-CM | POA: Diagnosis not present

## 2023-08-21 DIAGNOSIS — E669 Obesity, unspecified: Secondary | ICD-10-CM | POA: Diagnosis not present

## 2023-08-21 DIAGNOSIS — E785 Hyperlipidemia, unspecified: Secondary | ICD-10-CM | POA: Diagnosis not present

## 2023-08-21 DIAGNOSIS — Z23 Encounter for immunization: Secondary | ICD-10-CM | POA: Diagnosis not present

## 2023-08-21 DIAGNOSIS — M8580 Other specified disorders of bone density and structure, unspecified site: Secondary | ICD-10-CM | POA: Diagnosis not present

## 2023-08-21 DIAGNOSIS — F419 Anxiety disorder, unspecified: Secondary | ICD-10-CM | POA: Diagnosis not present

## 2023-08-24 ENCOUNTER — Telehealth: Payer: Self-pay | Admitting: Pharmacy Technician

## 2023-08-24 ENCOUNTER — Other Ambulatory Visit (HOSPITAL_COMMUNITY): Payer: Self-pay

## 2023-08-24 ENCOUNTER — Telehealth: Payer: Self-pay | Admitting: Nurse Practitioner

## 2023-08-24 NOTE — Telephone Encounter (Addendum)
Pharmacy Patient Advocate Encounter   Received notification from Pt Calls Messages that prior authorization for praluent is required/requested.   Insurance verification completed.   The patient is insured through [facility] ADVANTAGE/RX ADVANCE .   Per test claim: PA required; PA submitted to above mentioned insurance via CoverMyMeds Key/confirmation #/EOC [facility] Status is pending

## 2023-08-24 NOTE — Telephone Encounter (Signed)
Pt c/o medication issue:  1. Name of Medication:   Alirocumab (PRALUENT) 75 MG/ML SOAJ   2. How are you currently taking this medication (dosage and times per day)?   3. Are you having a reaction (difficulty breathing--STAT)?   4. What is your medication issue?   Caller Elnita Maxwell) stated she is following up on patient's prior authorization for this medication.  Ref# [id number].

## 2023-08-24 NOTE — Telephone Encounter (Signed)
Pharmacy Patient Advocate Encounter  Received notification from [facility] ADVANTAGE/RX ADVANCE that Prior Authorization for praluent has been APPROVED from 08/24/23 to 08/23/24. Ran test claim, Copay is $121.47- one month. This test claim was processed through [facility]- copay amounts may vary at other pharmacies due to pharmacy/plan contracts, or as the patient moves through the different stages of their insurance plan.   PA #/Case ID/Reference #: [id number]

## 2023-08-25 ENCOUNTER — Encounter: Payer: PPO | Attending: Family Medicine | Admitting: Dietician

## 2023-08-25 ENCOUNTER — Encounter: Payer: Self-pay | Admitting: Dietician

## 2023-08-25 DIAGNOSIS — R7303 Prediabetes: Secondary | ICD-10-CM | POA: Insufficient documentation

## 2023-08-25 NOTE — Progress Notes (Signed)
Patient was seen on 08/25/23 for the Core Session 14 of Diabetes Prevention Program course at [facility]. By the end of this session patients are able to complete the following objectives:   Learning Objectives: Give examples of problem social cues and helpful social cues.  Explain how to remove problem social cues and add helpful ones.  Describe ways of coping with vacations and social events such as parties, holidays, and visits from relatives and friends.  Create an action plan to change a problem social cue and add a helpful one.   Goals:  Record weight taken outside of class.  Track foods and beverages eaten each day in the "Food and Activity Tracker," including calories and fat grams for each item.   Track activity type, minutes you were active, and distance you reached each day in the "Food and Activity Tracker."  Do your best to reach activity goal for the week. Use action plan created during session to change a problem social cue and add a helpful social cue.  Answer questions regarding success of changing social cues on "To Do Next Week" handout.   Follow-Up Plan: Attend Core Session 15 next week.  Bring completed "Food and Activity Tracker" next week to be reviewed by Lifestyle Coach.

## 2023-08-28 DIAGNOSIS — I2510 Atherosclerotic heart disease of native coronary artery without angina pectoris: Secondary | ICD-10-CM | POA: Diagnosis not present

## 2023-08-28 DIAGNOSIS — F419 Anxiety disorder, unspecified: Secondary | ICD-10-CM | POA: Diagnosis not present

## 2023-08-28 DIAGNOSIS — Z6831 Body mass index (BMI) 31.0-31.9, adult: Secondary | ICD-10-CM | POA: Diagnosis not present

## 2023-08-28 DIAGNOSIS — R7303 Prediabetes: Secondary | ICD-10-CM | POA: Diagnosis not present

## 2023-08-28 DIAGNOSIS — E669 Obesity, unspecified: Secondary | ICD-10-CM | POA: Diagnosis not present

## 2023-08-28 DIAGNOSIS — E785 Hyperlipidemia, unspecified: Secondary | ICD-10-CM | POA: Diagnosis not present

## 2023-09-04 DIAGNOSIS — H5203 Hypermetropia, bilateral: Secondary | ICD-10-CM | POA: Diagnosis not present

## 2023-09-04 DIAGNOSIS — H43811 Vitreous degeneration, right eye: Secondary | ICD-10-CM | POA: Diagnosis not present

## 2023-09-04 DIAGNOSIS — H2513 Age-related nuclear cataract, bilateral: Secondary | ICD-10-CM | POA: Diagnosis not present

## 2023-09-08 ENCOUNTER — Encounter: Payer: Self-pay | Admitting: Dietician

## 2023-09-08 ENCOUNTER — Encounter: Payer: PPO | Admitting: Dietician

## 2023-09-08 DIAGNOSIS — R7303 Prediabetes: Secondary | ICD-10-CM

## 2023-09-08 NOTE — Progress Notes (Signed)
On 09/08/23 patient completed a post core session of the Diabetes Prevention Program course with [facility]. By the end of this session patients are able to complete the following objectives:   Learning Objectives: Identify different types of stressors List effects of stress on health and healthy lifestyle choices Discuss how stress affects lifestyle choices  Name healthy ways to manage and avoid stressors   Goals:  Track foods and beverages eaten each day in the "Food and Activity Tracker," including calories and fat grams for each item.   Track activity type, minutes you were active, and distance you reached each day in the "Food and Activity Tracker."   Follow-Up Plan: Attend next session.  Complete "Food and Activity Trackers" before next session to be reviewed by Lifestyle Coach.

## 2023-09-21 ENCOUNTER — Other Ambulatory Visit: Payer: Self-pay | Admitting: Family Medicine

## 2023-09-21 DIAGNOSIS — Z1231 Encounter for screening mammogram for malignant neoplasm of breast: Secondary | ICD-10-CM

## 2023-09-22 ENCOUNTER — Encounter: Payer: PPO | Attending: Family Medicine | Admitting: Dietician

## 2023-09-22 ENCOUNTER — Encounter: Payer: Self-pay | Admitting: Dietician

## 2023-09-22 ENCOUNTER — Ambulatory Visit
Admission: RE | Admit: 2023-09-22 | Discharge: 2023-09-22 | Disposition: A | Discharge status: Home / Self Care | Payer: PPO | Source: Ambulatory Visit | Attending: Obstetrics & Gynecology | Admitting: Obstetrics & Gynecology

## 2023-09-22 DIAGNOSIS — M8580 Other specified disorders of bone density and structure, unspecified site: Secondary | ICD-10-CM

## 2023-09-22 DIAGNOSIS — R7303 Prediabetes: Secondary | ICD-10-CM | POA: Insufficient documentation

## 2023-09-22 NOTE — Progress Notes (Signed)
 Patient was seen on 09/22/23 for the Core Session 16 of Diabetes Prevention Program course at [facility]. By the end of this session patients are able to complete the following objectives:   Learning Objectives: Measure their progress toward weight and physical activity goals since Session 1.  Develop a plan for improving progress, if their goals have not yet been attained.  Describe ways to stay motivated long-term.   Goals:  Record weight taken outside of class.  Track foods and beverages eaten each day in the Food and Activity Tracker, including calories and fat grams for each item.   Track activity type, minutes you were active, and distance you reached each day in the Food and Activity Tracker.  Utilize action plan to help stay motivated and complete questions on To Do List.   Follow-Up Plan: Attend session 17 in two weeks.  Bring completed Food and Activity Tracker next session to be reviewed by Lifestyle Coach.

## 2023-10-13 ENCOUNTER — Encounter: Payer: PPO | Admitting: Dietician

## 2023-10-25 ENCOUNTER — Encounter (HOSPITAL_BASED_OUTPATIENT_CLINIC_OR_DEPARTMENT_OTHER): Payer: Self-pay | Admitting: Cardiology

## 2023-10-25 ENCOUNTER — Ambulatory Visit (HOSPITAL_BASED_OUTPATIENT_CLINIC_OR_DEPARTMENT_OTHER): Payer: PPO | Admitting: Cardiology

## 2023-10-25 VITALS — BP 128/76 | HR 70 | Ht 69.0 in | Wt 218.7 lb

## 2023-10-25 DIAGNOSIS — E119 Type 2 diabetes mellitus without complications: Secondary | ICD-10-CM

## 2023-10-25 DIAGNOSIS — E7800 Pure hypercholesterolemia, unspecified: Secondary | ICD-10-CM

## 2023-10-25 DIAGNOSIS — I2584 Coronary atherosclerosis due to calcified coronary lesion: Secondary | ICD-10-CM

## 2023-10-25 DIAGNOSIS — E7841 Elevated Lipoprotein(a): Secondary | ICD-10-CM

## 2023-10-25 DIAGNOSIS — R931 Abnormal findings on diagnostic imaging of heart and coronary circulation: Secondary | ICD-10-CM | POA: Diagnosis not present

## 2023-10-25 DIAGNOSIS — I2510 Atherosclerotic heart disease of native coronary artery without angina pectoris: Secondary | ICD-10-CM | POA: Diagnosis not present

## 2023-10-25 NOTE — Progress Notes (Signed)
 Cardiology Office Note:  .   Date:  10/25/2023  ID:  Abigail Byrd, DOB 1955/10/01, MRN [id number] PCP: Aisha Harvey, MD  [facility] Providers Cardiologist:  Shelda Bruckner, MD {  History of Present Illness: .   Abigail Byrd is a 68 y.o. female with CAD, hyperlipidemia, family history of CAD. She was previously followed by Dr. Rolan and Dr. Hobart, and she established care with me on 10/25/23.  Pertinent CV history: Ca score 1252 (99%ile). Had chest pain, had cath 07/2022 which showed heavy calcification but nonobstructive CAD. Elevated lp(a) at 191. Echo unremarkable.  Today: Here with her husband today. Comes with many questions. Has a lot of anxiety related to her diagnosis. Reviewed her testing, risk, medications, symptoms to watch for. Discussed lp(a), data/clinical trials in process. See summary below.  ROS: Denies chest pain, shortness of breath at rest or with normal exertion. No PND, orthopnea, LE edema or unexpected weight gain. No syncope or palpitations. ROS otherwise negative except as noted.   Studies Reviewed: SABRA    EKG:  EKG Interpretation Date/Time:  Wednesday October 25 2023 13:30:22 EST Ventricular Rate:  65 PR Interval:  186 QRS Duration:  98 QT Interval:  386 QTC Calculation: 401 R Axis:   -9  Text Interpretation: Normal sinus rhythm Incomplete right bundle branch block Confirmed by Bruckner Shelda [phone]) on 10/25/2023 2:15:45 PM    Physical Exam:   VS:  BP 128/76   Pulse 70   Ht 5' 9 (1.753 m)   Wt 218 lb 11.2 oz (99.2 kg)   SpO2 96%   BMI 32.30 kg/m    Wt Readings from Last 3 Encounters:  10/25/23 218 lb 11.2 oz (99.2 kg)  05/16/23 206 lb (93.4 kg)  04/27/23 206 lb (93.4 kg)    GEN: Well nourished, well developed in no acute distress HEENT: Normal, moist mucous membranes NECK: No JVD CARDIAC: regular rhythm, normal S1 and S2, no rubs or gallops. No murmur. VASCULAR: Radial and DP pulses 2+ bilaterally. No carotid  bruits RESPIRATORY:  Clear to auscultation without rales, wheezing or rhonchi  ABDOMEN: Soft, non-tender, non-distended MUSCULOSKELETAL:  Ambulates independently SKIN: Warm and dry, no edema NEUROLOGIC:  Alert and oriented x 3. No focal neuro deficits noted. PSYCHIATRIC:  Normal affect    ASSESSMENT AND PLAN: .    Nonobstructive CAD, with heavy calcification Hypercholesterolemia Elevated lp(a) -reviewed her testing, workup, treatment at length today -LDL 29 on praluent  and rosuvastatin  40 mg. -lp(a) 191. Discussed current limited management for this (she is on PCKS9i) but hope for new therapies in the near future -reviewed screening of 1st degree family members.  -discussed cath, Ct, how calcification differs from obstruction -reviewed red flag warning signs that need immediate medical attention  Type II diabetes vs prediabetes -last A1c 6.2  -discussed Wegovy today; discussed data for secondary prevention in people with history of MI or CVA (she has not had events)  CV risk counseling and prevention -recommend heart healthy/Mediterranean diet, with whole grains, fruits, vegetable, fish, lean meats, nuts, and olive oil. Limit salt. -recommend moderate walking, 3-5 times/week for 30-50 minutes each session. Aim for at least 150 minutes.week. Goal should be pace of 3 miles/hours, or walking 1.5 miles in 30 minutes -recommend avoidance of tobacco products. Avoid excess alcohol.  Dispo: 1 year  Total time of encounter: I spent 54 minutes dedicated to the care of this patient on the date of this encounter to include pre-visit review of records, face-to-face time  with the patient discussing conditions above, and clinical documentation with the electronic health record. We specifically spent time today discussing prior test results extensively, review of guidelines including target numbers, best therapy options, risk reduction data, and symptoms to watch for   Signed, Shelda Bruckner, MD   Shelda Bruckner, MD, PhD, [facility]  [facility]  [place]  [facility] at [facility] [address], [address] [place], [place] [address] [phone]

## 2023-10-25 NOTE — Patient Instructions (Signed)
 Medication Instructions:  Your physician recommends that you continue on your current medications as directed. Please refer to the Current Medication list given to you today.  *If you need a refill on your cardiac medications before your next appointment, please call your pharmacy*   Follow-Up: At [facility], you and your health needs are our priority.  As part of our continuing mission to provide you with exceptional heart care, we have created designated Provider Care Teams.  These Care Teams include your primary Cardiologist (physician) and Advanced Practice Providers (APPs -  Physician Assistants and Nurse Practitioners) who all work together to provide you with the care you need, when you need it.  We recommend signing up for the patient portal called "MyChart".  Sign up information is provided on this After Visit Summary.  MyChart is used to connect with patients for Virtual Visits (Telemedicine).  Patients are able to view lab/test results, encounter notes, upcoming appointments, etc.  Non-urgent messages can be sent to your provider as well.   To learn more about what you can do with MyChart, go to [email or web address].     Your next appointment:   12 month(s)  Provider:   Jodelle Red, MD

## 2023-11-02 ENCOUNTER — Ambulatory Visit
Admission: RE | Admit: 2023-11-02 | Discharge: 2023-11-02 | Disposition: A | Discharge status: Home / Self Care | Payer: PPO | Source: Ambulatory Visit | Attending: Family Medicine | Admitting: Family Medicine

## 2023-11-02 DIAGNOSIS — Z1231 Encounter for screening mammogram for malignant neoplasm of breast: Secondary | ICD-10-CM

## 2023-11-17 ENCOUNTER — Encounter: Payer: Self-pay | Admitting: Dietician

## 2023-11-17 ENCOUNTER — Encounter: Payer: PPO | Attending: Family Medicine | Admitting: Dietician

## 2023-11-17 DIAGNOSIS — R7303 Prediabetes: Secondary | ICD-10-CM | POA: Insufficient documentation

## 2023-11-17 NOTE — Progress Notes (Addendum)
 On 11/17/23 patient completed a post core session of Diabetes Prevention Program course with [facility]. By the end of this session patients are able to complete the following objectives:    Learning Objectives: Explain how glucose is used in the body and it's relationship with insulin/insulin resistance.  Identify symptoms of diabetes.  Describe lab tests used to diagnose diabetes.  Describe health complications and conditions related to diabetes.   Goals:  Record weight taken outside of class.  Track foods and beverages eaten each day in the "Food and Activity Tracker," including calories and fat grams for each item.   Track activity type, minutes you were active, and distance you reached each day in the "Food and Activity Tracker."   Follow-Up Plan: Attend next session.  Email completed "Food and Activity Trackers" before next session to be reviewed by Lifestyle Coach.

## 2023-11-30 ENCOUNTER — Encounter (HOSPITAL_BASED_OUTPATIENT_CLINIC_OR_DEPARTMENT_OTHER): Payer: Self-pay

## 2023-11-30 ENCOUNTER — Telehealth (HOSPITAL_COMMUNITY): Payer: Self-pay | Admitting: Licensed Clinical Social Worker

## 2023-11-30 NOTE — Telephone Encounter (Signed)
 H&V Care Navigation CSW Progress Note  Clinical Social Worker received return call from pt to discuss support options for anxiety while living with Heart Calcification.  Patient reports she was diagnosed about a year ago with Heart calcification and that it has been difficult for her- had a lot of anxiety about dying.  She reports she was started on lexapro about a year ago to help with this by her PCP and has concerns about regaining some weight that she had lost since starting it (had lost 40lbs but has put back on 20lb over the past year).  She reports improvement in mental state while on lexapro and reports she has been seeing an Network engineer but since so much of her anxiety has been around her health does not think they have been helpful with those concerns as they are not familiar with her condition.    Overall her main concern is are the current benefits of being on lexapro worth the potential risk of increased weight gain as she is not struggling with mental health currently but was considering additional supports to allow her to transition off of medication if the lexapro was harmful to her overall health condition.  CSW suggested reaching out to PCP office who prescribes this medication to discuss these concerns further since they are medical in nature.    Encouraged patient to reach back out to CSW if she wanted to get additional mental health support such as information regarding online support groups through the [employer]  (At this time not interested since they do not have heart calcification specific options).   SDOH Screenings   Food Insecurity: No Food Insecurity (07/31/2022)  Housing: Low Risk  (07/31/2022)  Transportation Needs: No Transportation Needs (07/31/2022)  Utilities: Not At Risk (07/31/2022)  Tobacco Use: Low Risk  (11/17/2023)   Burna Sis, LCSW Clinical Social Worker [facility] Desk#: [phone] Cell#:  [phone]

## 2023-11-30 NOTE — Telephone Encounter (Signed)
 Hi ladies, either of you know of a group for this?

## 2023-11-30 NOTE — Telephone Encounter (Signed)
 CSW consulted to speak with pt regarding concerns with anxiety surrounding her cardiac condition.  CSW called pt to discuss- unable to reach- left VM requesting return call  Burna Sis, LCSW Clinical Social Worker [facility] Desk#: [phone] Cell#: [phone]

## 2023-11-30 NOTE — Telephone Encounter (Signed)
 Hey this is the one [place] asked me to route to you! Thanks so much!

## 2023-12-08 ENCOUNTER — Encounter: Payer: PPO | Admitting: Dietician

## 2023-12-14 ENCOUNTER — Encounter (HOSPITAL_BASED_OUTPATIENT_CLINIC_OR_DEPARTMENT_OTHER): Payer: Self-pay

## 2023-12-15 ENCOUNTER — Encounter: Attending: Family Medicine | Admitting: Dietician

## 2023-12-15 ENCOUNTER — Encounter: Payer: Self-pay | Admitting: Dietician

## 2023-12-15 DIAGNOSIS — R7303 Prediabetes: Secondary | ICD-10-CM | POA: Insufficient documentation

## 2023-12-15 NOTE — Progress Notes (Signed)
 Patient was seen on 12/15/23 for the Diabetes Prevention Program course at [facility]. By the end of this session patients are able to complete the following objectives:   Learning Objectives: Describe the differences between unsaturated, saturated, and trans fat on heart health.  List dietary sources of unsaturated, saturated, and trans fats. Explain ways to reduce intake of saturated fat and replace them with heart healthy fats.  Goals:  Record weight taken outside of class.  Track foods and beverages eaten each day in the "Food and Activity Tracker," including calories and fat grams for each item.   Track activity type, minutes you were active, and distance you reached each day in the "Food and Activity Tracker."   Follow-Up Plan: Attend next session.  Bring completed "Food and Activity Trackers" to next session to be reviewed by Lifestyle Coach.

## 2023-12-27 DIAGNOSIS — I2510 Atherosclerotic heart disease of native coronary artery without angina pectoris: Secondary | ICD-10-CM | POA: Diagnosis not present

## 2023-12-27 DIAGNOSIS — F419 Anxiety disorder, unspecified: Secondary | ICD-10-CM | POA: Diagnosis not present

## 2023-12-27 DIAGNOSIS — R7303 Prediabetes: Secondary | ICD-10-CM | POA: Diagnosis not present

## 2023-12-27 DIAGNOSIS — E785 Hyperlipidemia, unspecified: Secondary | ICD-10-CM | POA: Diagnosis not present

## 2023-12-27 DIAGNOSIS — Z6832 Body mass index (BMI) 32.0-32.9, adult: Secondary | ICD-10-CM | POA: Diagnosis not present

## 2023-12-27 DIAGNOSIS — E669 Obesity, unspecified: Secondary | ICD-10-CM | POA: Diagnosis not present

## 2023-12-29 ENCOUNTER — Encounter: Payer: PPO | Admitting: Dietician

## 2024-01-03 ENCOUNTER — Other Ambulatory Visit: Payer: Self-pay | Admitting: Pharmacist

## 2024-01-03 MED ORDER — PRALUENT 75 MG/ML ~~LOC~~ SOAJ: 75.0000 mg | SUBCUTANEOUS | 11 refills | Status: DC

## 2024-01-05 ENCOUNTER — Encounter: Payer: PPO | Admitting: Dietician

## 2024-01-10 ENCOUNTER — Telehealth: Payer: Self-pay | Admitting: Cardiology

## 2024-01-10 NOTE — Telephone Encounter (Signed)
 Cardiac cath revealed no significant obstruction 07/2022.  Based on heavy calcification, she should continue to monitor for signs/symptoms concerning for heart attack or stroke, but based on my assessment of her on 01/09/2024 while playing tennis she was asymptomatic. She denied chest pain or dyspnea.  She was sweating because of the hot temperature and she had been playing tennis for several hours at that point. The fact that her HR improved with hydration is encouraging.  Would recommend that she continue with exercise and if she has additional episodes of tachycardia  I would recommend a 14-day ZIO. If she develops chest pain or shortness of breath with exertion, she should seek immediate medical attention.

## 2024-01-10 NOTE — Telephone Encounter (Signed)
 Spoke with patient who stated yesterday while playing tennis she felt a "fluttering" in her abdomen.  She checked her tennis app and is showed her HR at 210 After sitting and resting for few minutes she decided to check her HR on her apple app, HR 130 Was playing tennis with NP who checked her HR manually and was in the low 100's  Was advised likely just on the dehydrated side She drank Gatorade and ate some salty chips Denied any chest pain or shortness of breath  Only symptom was a warm feeling in her arms. After going home and resting/cooling off HR was in 70's-80's No issues since She is concerned coming from her calcium  she has  Offered her appointment with Dr Veryl Gottron next week but would like for [facility] to review to make sure no additional recommendations  Will forward to Charlton Cooler NP for review

## 2024-01-10 NOTE — Telephone Encounter (Signed)
 STAT if HR is under 50 or over 120  (normal HR is 60-100 beats per minute)  What is your heart rate? HR: 210 while playing tennis HR:130 yesterday afternoon   Do you have a log of your heart rate readings (document readings)? This morning: 60  Do you have any other symptoms? Warmness going down her arm

## 2024-01-10 NOTE — Telephone Encounter (Signed)
 Advised patient, verbalized understanding

## 2024-01-15 ENCOUNTER — Telehealth: Payer: Self-pay | Admitting: Cardiology

## 2024-01-15 ENCOUNTER — Telehealth: Payer: Self-pay | Admitting: Pharmacy Technician

## 2024-01-15 ENCOUNTER — Other Ambulatory Visit (HOSPITAL_COMMUNITY): Payer: Self-pay

## 2024-01-15 MED ORDER — PRALUENT 75 MG/ML ~~LOC~~ SOAJ: 75.0000 mg | SUBCUTANEOUS | 11 refills | Status: AC

## 2024-01-15 NOTE — Telephone Encounter (Signed)
 Prior auth grenewed until 09/18/24. Ran test claim for praluent . For a 28 day supply and the co-pay is 47.00 .

## 2024-01-15 NOTE — Telephone Encounter (Signed)
 Spoke with patient and scheduled her visit with Dr Veryl Gottron later this week Patient has had not further symptoms

## 2024-01-15 NOTE — Telephone Encounter (Signed)
 Patient c/o Palpitations: STAT if patient c/o lightheadedness, shortness of breath, or chest pain  How long have you had palpitations/irregular HR/ Afib? Are you having the symptoms now?  Last Tuesday during a tennis match patient felt flutters in her abdomen. HR was at 210 on her watch, but got down to the low 100's eventually. She says there was a nurse present, but when her HR went down she said ?nevermind? Medical forfeit from tennis match(es). She says she spoke with Myrl Askew, NP, who told her to just be on the lookout for it happening again, but she says she's been thinking about it and getting more anxious. She's wondering if it has anything to do with elevated calcium . Please advise.  Are you currently experiencing lightheadedness, SOB or CP?  No   Do you have a history of afib (atrial fibrillation) or irregular heart rhythm?  No   Have you checked your BP or HR? (document readings if available):  BP is usually good, but hasn't been checked.    Are you experiencing any other symptoms?  No

## 2024-01-15 NOTE — Telephone Encounter (Signed)
 Spoke with patient regarding Praluent   No new insurance this year, PA good through 08/2024 Called [facility] to discuss, they do not open until 10, will try back

## 2024-01-15 NOTE — Telephone Encounter (Signed)
 Hi I called [facility] and they said they never received the prescription you sent in on 01/03/24 for praluent . The pharmacist doubled checked and she said she never got it and is requesting this to be sent in again. I'm sorry.

## 2024-01-15 NOTE — Telephone Encounter (Signed)
 Rx updated, advised patient

## 2024-01-15 NOTE — Telephone Encounter (Signed)
 Pt c/o medication issue:  1. Name of Medication:  Alirocumab  (PRALUENT ) 75 MG/ML SOAJ   2. How are you currently taking this medication (dosage and times per day)?   3. Are you having a reaction (difficulty breathing--STAT)?   4. What is your medication issue?  Patient says a PA is needed for Praluent .

## 2024-01-18 ENCOUNTER — Ambulatory Visit (HOSPITAL_BASED_OUTPATIENT_CLINIC_OR_DEPARTMENT_OTHER): Admitting: Cardiology

## 2024-01-18 ENCOUNTER — Encounter (HOSPITAL_BASED_OUTPATIENT_CLINIC_OR_DEPARTMENT_OTHER): Payer: Self-pay | Admitting: Cardiology

## 2024-01-18 VITALS — BP 112/58 | HR 69 | Ht 69.0 in | Wt 222.5 lb

## 2024-01-18 DIAGNOSIS — I2510 Atherosclerotic heart disease of native coronary artery without angina pectoris: Secondary | ICD-10-CM

## 2024-01-18 DIAGNOSIS — E7800 Pure hypercholesterolemia, unspecified: Secondary | ICD-10-CM | POA: Diagnosis not present

## 2024-01-18 DIAGNOSIS — E7841 Elevated Lipoprotein(a): Secondary | ICD-10-CM

## 2024-01-18 DIAGNOSIS — E785 Hyperlipidemia, unspecified: Secondary | ICD-10-CM | POA: Diagnosis not present

## 2024-01-18 DIAGNOSIS — I2584 Coronary atherosclerosis due to calcified coronary lesion: Secondary | ICD-10-CM

## 2024-01-18 DIAGNOSIS — R931 Abnormal findings on diagnostic imaging of heart and coronary circulation: Secondary | ICD-10-CM | POA: Diagnosis not present

## 2024-01-18 NOTE — Patient Instructions (Signed)
 Medication Instructions:  Your physician recommends that you continue on your current medications as directed. Please refer to the Current Medication list given to you today.   *If you need a refill on your cardiac medications before your next appointment, please call your pharmacy*  Follow-Up: At [facility], you and your health needs are our priority.  As part of our continuing mission to provide you with exceptional heart care, our providers are all part of one team.  This team includes your primary Cardiologist (physician) and Advanced Practice Providers or APPs (Physician Assistants and Nurse Practitioners) who all work together to provide you with the care you need, when you need it.  Please follow up in January 2026 with Dr. Veryl Gottron, Slater Duncan, NP or Neomi Banks, NP   We recommend signing up for the patient portal called "MyChart".  Sign up information is provided on this After Visit Summary.  MyChart is used to connect with patients for Virtual Visits (Telemedicine).  Patients are able to view lab/test results, encounter notes, upcoming appointments, etc.  Non-urgent messages can be sent to your provider as well.   To learn more about what you can do with MyChart, go to [email or web address].

## 2024-01-18 NOTE — Progress Notes (Signed)
 Cardiology Office Note:  .   Date:  01/18/2024  ID:  Abigail Byrd, DOB Apr 28, 1956, MRN [id number] PCP: Helyn Lobstein, MD  [facility] Providers Cardiologist:  Sheryle Donning, MD {  History of Present Illness: .   Abigail Byrd is a 68 y.o. female with CAD, hyperlipidemia, family history of CAD. She was previously followed by Dr. Mitzie Anda and Dr. Ardell Beauvais, and she established care with me on 10/25/23.  Pertinent CV history: Ca score 1252 (99%ile). Had chest pain, had cath 07/2022 which showed heavy calcification but nonobstructive CAD. Elevated lp(a) at 191. Echo unremarkable.  Today: Here for urgent visit. Per phone call 01/15/24, noted flutters in her abdomen during a tennis match, HR on her phone read as 210 bpm, came down to 100s with rest.  She notes that she was running/being very active (even more than usual) during this particular tennis match. Checked HR on tennis app on her smartwatch, read HR 210 bpm, then checked again, was 130 bpm. Only other symptom was warmth on her arm, which she has had checked out before without finding of abnormalities.  She called [employer] to discuss the app, then reviewed. Notes that periodically there are fast HR over the last 5 years, occurs every few months, up to 170s-180s. She does have the ability to check ECGs but hasn't done this, reviewed today.  No further events since this episode. Reviewed her list of questions as well, esp regarding her prior arterial calcification and concerns.   Asking about GLP. Has not formally been diagnosed with diabetes, but has been discussing this with her primary. We discussed today as well.  Reviewed signs/symptoms of heart attack today.  ROS: Denies chest pain, shortness of breath at rest or with normal exertion. No PND, orthopnea, LE edema or unexpected weight gain. No syncope. ROS otherwise negative except as noted.   Studies Reviewed: Aaron Aas    EKG:  EKG Interpretation Date/Time:  Thursday Jan 18 2024 14:56:20 EDT Ventricular Rate:  69 PR Interval:  182 QRS Duration:  98 QT Interval:  384 QTC Calculation: 411 R Axis:   -20  Text Interpretation: Normal sinus rhythm Incomplete right bundle branch block Nonspecific T wave abnormality When compared with ECG of 25-Oct-2023 13:30, No significant change was found Confirmed by Sheryle Donning [phone]) on 01/18/2024 3:24:28 PM    Physical Exam:   VS:  BP (!) 112/58   Pulse 69   Ht 5\' 9"  (1.753 m)   Wt 222 lb 8 oz (100.9 kg)   SpO2 98%   BMI 32.86 kg/m    Wt Readings from Last 3 Encounters:  01/18/24 222 lb 8 oz (100.9 kg)  10/25/23 218 lb 11.2 oz (99.2 kg)  05/16/23 206 lb (93.4 kg)    GEN: Well nourished, well developed in no acute distress HEENT: Normal, moist mucous membranes NECK: No JVD CARDIAC: regular rhythm, normal S1 and S2, no rubs or gallops. No murmur. VASCULAR: Radial and DP pulses 2+ bilaterally. No carotid bruits RESPIRATORY:  Clear to auscultation without rales, wheezing or rhonchi  ABDOMEN: Soft, non-tender, non-distended MUSCULOSKELETAL:  Ambulates independently SKIN: Warm and dry, no edema NEUROLOGIC:  Alert and oriented x 3. No focal neuro deficits noted. PSYCHIATRIC:  Normal affect    ASSESSMENT AND PLAN: .    Tachycardia, flutters -discussed signs/symptoms today. If these recur, she will try to capture on an ECG and send to me  Nonobstructive CAD, with heavy calcification Hypercholesterolemia Elevated lp(a) -discussed at length at our  initial visit -LDL 29 on praluent  and rosuvastatin  40 mg. -lp(a) 191. Discussed current limited management for this (she is on PCKS9i) but hope for new therapies in the near future -reviewed screening of 1st degree family members.  -reviewed red flag warning signs that need immediate medical attention  Type II diabetes vs prediabetes -last A1c 6.2  -we have discussed data for GLP at prior visit  CV risk counseling and prevention -recommend heart  healthy/Mediterranean diet, with whole grains, fruits, vegetable, fish, lean meats, nuts, and olive oil. Limit salt. -recommend moderate walking, 3-5 times/week for 30-50 minutes each session. Aim for at least 150 minutes.week. Goal should be pace of 3 miles/hours, or walking 1.5 miles in 30 minutes -recommend avoidance of tobacco products. Avoid excess alcohol.  Dispo: 1 year  Signed, Sheryle Donning, MD   Sheryle Donning, MD, PhD, [facility]  [facility]    Heart & Vascular at [facility] at [facility] [address] [place], [place] [address] [phone]

## 2024-01-31 DIAGNOSIS — R7303 Prediabetes: Secondary | ICD-10-CM | POA: Diagnosis not present

## 2024-01-31 DIAGNOSIS — F419 Anxiety disorder, unspecified: Secondary | ICD-10-CM | POA: Diagnosis not present

## 2024-01-31 DIAGNOSIS — I2510 Atherosclerotic heart disease of native coronary artery without angina pectoris: Secondary | ICD-10-CM | POA: Diagnosis not present

## 2024-01-31 DIAGNOSIS — E669 Obesity, unspecified: Secondary | ICD-10-CM | POA: Diagnosis not present

## 2024-01-31 DIAGNOSIS — E785 Hyperlipidemia, unspecified: Secondary | ICD-10-CM | POA: Diagnosis not present

## 2024-01-31 DIAGNOSIS — Z6832 Body mass index (BMI) 32.0-32.9, adult: Secondary | ICD-10-CM | POA: Diagnosis not present

## 2024-02-02 ENCOUNTER — Encounter: Payer: Self-pay | Admitting: Dietician

## 2024-02-02 ENCOUNTER — Encounter: Payer: PPO | Attending: Family Medicine | Admitting: Dietician

## 2024-02-02 DIAGNOSIS — R7303 Prediabetes: Secondary | ICD-10-CM

## 2024-02-02 NOTE — Progress Notes (Signed)
 On 02/02/24 patient completed a post core session of the Diabetes Prevention Program course with [facility]. By the end of this session patients are able to complete the following objectives:    Learning Objectives: Describe how to incorporate more fruits and vegetables into meals. List criteria for selecting good quality fruits and vegetables at the store.  Define mindful eating. List the benefits of eating mindfully.   Goals:  Record weight taken outside of class.  Track foods and beverages eaten each day in the "Food and Activity Tracker," including calories and fat grams for each item.   Track activity type, minutes you were active, and distance you reached each day in the "Food and Activity Tracker."   Follow-Up Plan: Attend next session.  Email completed "Food and Activity Trackers" before next session to be reviewed by Lifestyle Coach.

## 2024-02-09 ENCOUNTER — Encounter: Payer: PPO | Admitting: Dietician

## 2024-02-26 DIAGNOSIS — I2510 Atherosclerotic heart disease of native coronary artery without angina pectoris: Secondary | ICD-10-CM | POA: Diagnosis not present

## 2024-02-28 DIAGNOSIS — Z6831 Body mass index (BMI) 31.0-31.9, adult: Secondary | ICD-10-CM | POA: Diagnosis not present

## 2024-02-28 DIAGNOSIS — R7303 Prediabetes: Secondary | ICD-10-CM | POA: Diagnosis not present

## 2024-02-28 DIAGNOSIS — I2510 Atherosclerotic heart disease of native coronary artery without angina pectoris: Secondary | ICD-10-CM | POA: Diagnosis not present

## 2024-02-28 DIAGNOSIS — E669 Obesity, unspecified: Secondary | ICD-10-CM | POA: Diagnosis not present

## 2024-02-28 DIAGNOSIS — E785 Hyperlipidemia, unspecified: Secondary | ICD-10-CM | POA: Diagnosis not present

## 2024-02-28 DIAGNOSIS — F419 Anxiety disorder, unspecified: Secondary | ICD-10-CM | POA: Diagnosis not present

## 2024-03-06 DIAGNOSIS — I2510 Atherosclerotic heart disease of native coronary artery without angina pectoris: Secondary | ICD-10-CM | POA: Diagnosis not present

## 2024-03-06 DIAGNOSIS — Z0000 Encounter for general adult medical examination without abnormal findings: Secondary | ICD-10-CM | POA: Diagnosis not present

## 2024-03-06 DIAGNOSIS — E785 Hyperlipidemia, unspecified: Secondary | ICD-10-CM | POA: Diagnosis not present

## 2024-03-06 DIAGNOSIS — E669 Obesity, unspecified: Secondary | ICD-10-CM | POA: Diagnosis not present

## 2024-03-06 DIAGNOSIS — Z6832 Body mass index (BMI) 32.0-32.9, adult: Secondary | ICD-10-CM | POA: Diagnosis not present

## 2024-03-06 DIAGNOSIS — F419 Anxiety disorder, unspecified: Secondary | ICD-10-CM | POA: Diagnosis not present

## 2024-03-06 DIAGNOSIS — J45909 Unspecified asthma, uncomplicated: Secondary | ICD-10-CM | POA: Diagnosis not present

## 2024-03-06 DIAGNOSIS — M8580 Other specified disorders of bone density and structure, unspecified site: Secondary | ICD-10-CM | POA: Diagnosis not present

## 2024-03-06 DIAGNOSIS — R7303 Prediabetes: Secondary | ICD-10-CM | POA: Diagnosis not present

## 2024-03-08 ENCOUNTER — Encounter: Payer: PPO | Admitting: Dietician

## 2024-03-18 DIAGNOSIS — E663 Overweight: Secondary | ICD-10-CM | POA: Diagnosis not present

## 2024-03-18 DIAGNOSIS — J45909 Unspecified asthma, uncomplicated: Secondary | ICD-10-CM | POA: Diagnosis not present

## 2024-03-18 DIAGNOSIS — E669 Obesity, unspecified: Secondary | ICD-10-CM | POA: Diagnosis not present

## 2024-03-18 DIAGNOSIS — I2510 Atherosclerotic heart disease of native coronary artery without angina pectoris: Secondary | ICD-10-CM | POA: Diagnosis not present

## 2024-03-25 DIAGNOSIS — H35421 Microcystoid degeneration of retina, right eye: Secondary | ICD-10-CM | POA: Diagnosis not present

## 2024-03-25 DIAGNOSIS — H43813 Vitreous degeneration, bilateral: Secondary | ICD-10-CM | POA: Diagnosis not present

## 2024-03-25 DIAGNOSIS — H2513 Age-related nuclear cataract, bilateral: Secondary | ICD-10-CM | POA: Diagnosis not present

## 2024-03-29 ENCOUNTER — Encounter: Attending: Family Medicine | Admitting: Dietician

## 2024-03-29 ENCOUNTER — Encounter: Payer: Self-pay | Admitting: Dietician

## 2024-03-29 DIAGNOSIS — R7303 Prediabetes: Secondary | ICD-10-CM | POA: Insufficient documentation

## 2024-03-29 NOTE — Progress Notes (Signed)
 On 03/29/24 pt completed a post core session of the Diabetes Prevention Program course virtually with [facility]. By the end of this session patients are able to complete the following objectives:     Learning Objectives: Reflect on lifestyle changes they have made since starting the DPP.  Set long-term goals to promote continued maintenance of lifestyle changes made during the program.   Goals:  Work toward reaching new long-term goals set during class.   Follow-Up Plan: Contact Lifestyle Coach with questions/concerns PRN.

## 2024-03-30 DIAGNOSIS — I2510 Atherosclerotic heart disease of native coronary artery without angina pectoris: Secondary | ICD-10-CM | POA: Diagnosis not present

## 2024-04-05 ENCOUNTER — Encounter: Payer: PPO | Admitting: Dietician

## 2024-04-10 DIAGNOSIS — R7303 Prediabetes: Secondary | ICD-10-CM | POA: Diagnosis not present

## 2024-04-10 DIAGNOSIS — E669 Obesity, unspecified: Secondary | ICD-10-CM | POA: Diagnosis not present

## 2024-04-10 DIAGNOSIS — E785 Hyperlipidemia, unspecified: Secondary | ICD-10-CM | POA: Diagnosis not present

## 2024-04-10 DIAGNOSIS — Z6832 Body mass index (BMI) 32.0-32.9, adult: Secondary | ICD-10-CM | POA: Diagnosis not present

## 2024-04-10 DIAGNOSIS — I2510 Atherosclerotic heart disease of native coronary artery without angina pectoris: Secondary | ICD-10-CM | POA: Diagnosis not present

## 2024-04-10 DIAGNOSIS — F419 Anxiety disorder, unspecified: Secondary | ICD-10-CM | POA: Diagnosis not present

## 2024-04-15 ENCOUNTER — Encounter (HOSPITAL_BASED_OUTPATIENT_CLINIC_OR_DEPARTMENT_OTHER): Payer: Self-pay

## 2024-04-18 DIAGNOSIS — I2510 Atherosclerotic heart disease of native coronary artery without angina pectoris: Secondary | ICD-10-CM | POA: Diagnosis not present

## 2024-04-18 DIAGNOSIS — E669 Obesity, unspecified: Secondary | ICD-10-CM | POA: Diagnosis not present

## 2024-04-18 DIAGNOSIS — E663 Overweight: Secondary | ICD-10-CM | POA: Diagnosis not present

## 2024-04-18 DIAGNOSIS — J45909 Unspecified asthma, uncomplicated: Secondary | ICD-10-CM | POA: Diagnosis not present

## 2024-04-22 DIAGNOSIS — H43813 Vitreous degeneration, bilateral: Secondary | ICD-10-CM | POA: Diagnosis not present

## 2024-04-22 DIAGNOSIS — H35421 Microcystoid degeneration of retina, right eye: Secondary | ICD-10-CM | POA: Diagnosis not present

## 2024-04-22 DIAGNOSIS — H2513 Age-related nuclear cataract, bilateral: Secondary | ICD-10-CM | POA: Diagnosis not present

## 2024-04-29 DIAGNOSIS — I2510 Atherosclerotic heart disease of native coronary artery without angina pectoris: Secondary | ICD-10-CM | POA: Diagnosis not present

## 2024-05-19 DIAGNOSIS — J45909 Unspecified asthma, uncomplicated: Secondary | ICD-10-CM | POA: Diagnosis not present

## 2024-05-19 DIAGNOSIS — E669 Obesity, unspecified: Secondary | ICD-10-CM | POA: Diagnosis not present

## 2024-05-19 DIAGNOSIS — I2510 Atherosclerotic heart disease of native coronary artery without angina pectoris: Secondary | ICD-10-CM | POA: Diagnosis not present

## 2024-05-19 DIAGNOSIS — E663 Overweight: Secondary | ICD-10-CM | POA: Diagnosis not present

## 2024-05-28 DIAGNOSIS — R7303 Prediabetes: Secondary | ICD-10-CM | POA: Diagnosis not present

## 2024-05-28 DIAGNOSIS — E785 Hyperlipidemia, unspecified: Secondary | ICD-10-CM | POA: Diagnosis not present

## 2024-05-28 DIAGNOSIS — Z6832 Body mass index (BMI) 32.0-32.9, adult: Secondary | ICD-10-CM | POA: Diagnosis not present

## 2024-05-28 DIAGNOSIS — F419 Anxiety disorder, unspecified: Secondary | ICD-10-CM | POA: Diagnosis not present

## 2024-05-28 DIAGNOSIS — E669 Obesity, unspecified: Secondary | ICD-10-CM | POA: Diagnosis not present

## 2024-05-28 DIAGNOSIS — I2510 Atherosclerotic heart disease of native coronary artery without angina pectoris: Secondary | ICD-10-CM | POA: Diagnosis not present

## 2024-05-29 DIAGNOSIS — I2510 Atherosclerotic heart disease of native coronary artery without angina pectoris: Secondary | ICD-10-CM | POA: Diagnosis not present

## 2024-06-13 DIAGNOSIS — F4322 Adjustment disorder with anxiety: Secondary | ICD-10-CM | POA: Diagnosis not present

## 2024-06-18 ENCOUNTER — Ambulatory Visit (HOSPITAL_BASED_OUTPATIENT_CLINIC_OR_DEPARTMENT_OTHER): Admitting: Internal Medicine

## 2024-06-18 ENCOUNTER — Encounter (HOSPITAL_BASED_OUTPATIENT_CLINIC_OR_DEPARTMENT_OTHER): Payer: Self-pay | Admitting: Internal Medicine

## 2024-06-18 ENCOUNTER — Telehealth (HOSPITAL_BASED_OUTPATIENT_CLINIC_OR_DEPARTMENT_OTHER): Payer: Self-pay | Admitting: *Deleted

## 2024-06-18 ENCOUNTER — Encounter (HOSPITAL_BASED_OUTPATIENT_CLINIC_OR_DEPARTMENT_OTHER): Payer: Self-pay

## 2024-06-18 VITALS — BP 108/72 | HR 72 | Ht 69.0 in | Wt 226.3 lb

## 2024-06-18 DIAGNOSIS — E669 Obesity, unspecified: Secondary | ICD-10-CM | POA: Diagnosis not present

## 2024-06-18 DIAGNOSIS — J45909 Unspecified asthma, uncomplicated: Secondary | ICD-10-CM | POA: Diagnosis not present

## 2024-06-18 DIAGNOSIS — E7800 Pure hypercholesterolemia, unspecified: Secondary | ICD-10-CM | POA: Diagnosis not present

## 2024-06-18 DIAGNOSIS — I2584 Coronary atherosclerosis due to calcified coronary lesion: Secondary | ICD-10-CM

## 2024-06-18 DIAGNOSIS — E663 Overweight: Secondary | ICD-10-CM | POA: Diagnosis not present

## 2024-06-18 DIAGNOSIS — I2510 Atherosclerotic heart disease of native coronary artery without angina pectoris: Secondary | ICD-10-CM

## 2024-06-18 DIAGNOSIS — E785 Hyperlipidemia, unspecified: Secondary | ICD-10-CM | POA: Diagnosis not present

## 2024-06-18 DIAGNOSIS — R931 Abnormal findings on diagnostic imaging of heart and coronary circulation: Secondary | ICD-10-CM | POA: Diagnosis not present

## 2024-06-18 DIAGNOSIS — E782 Mixed hyperlipidemia: Secondary | ICD-10-CM

## 2024-06-18 NOTE — Patient Instructions (Signed)
 Medication Instructions:   Your physician recommends that you continue on your current medications as directed. Please refer to the Current Medication list given to you today.  *If you need a refill on your cardiac medications before your next appointment, please call your pharmacy*  Lab Work:  SOMETIME THIS WEEK OR NEXT WEEK HERE AT [facility] ON [address]RD [address]LOOR [address]--NMR LIPOPROFILE, LIPOPROTEIN A, AND HIGH SENSITIVITY CRP--PLEASE COME FASTING TO THIS LAB APPOINTMENT  If you have labs (blood work) drawn today and your tests are completely normal, you will receive your results only by: MyChart Message (if you have MyChart) OR A paper copy in the mail If you have any lab test that is abnormal or we need to change your treatment, we will call you to review the results.   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 ID: [id number]    Follow-Up:  AS PLANNED WITH DR. LONNI

## 2024-06-18 NOTE — Progress Notes (Addendum)
 LIPID CLINIC CONSULT NOTE  Chief Complaint:  Manage dyslipidemia  Primary Care Physician: Aisha Harvey, MD  Primary Cardiologist:  Shelda Bruckner, MD  HPI:  Abigail Byrd is a 68 y.o. female who is being seen today for the evaluation of dyslipidemia at the request of Aisha Harvey, MD. is a pleasant 68 year old female who has been seen by Dr. Bruckner with a history of significant coronary artery calcification, with a score of 1252 (99th percentile).  As she was having chest pain symptoms she underwent cardiac catheterization in November 2023 which showed heavy coronary calcification but nonobstructive disease.  She also has a history of elevated LP(a) at 191.  She has been on Praluent  75 mg every 2 weeks and rosuvastatin  40 mg daily.  Labs in June 2025 showed total cholesterol 112, HDL 60, triglycerides 57 and LDL 39.  She returns today for further information regarding lipid management and her elevated LP(a).  I had a long discussion with her and her husband regarding her coronary artery calcification, the mechanism of calcification and vascular disease as well as my perceived risk of cardiovascular events in the future.  She is interested in any other possible avenues that may reduce her cardiovascular risk.  PMHx:  Past Medical History:  Diagnosis Date   Acid reflux    Allergy    Anxiety    Asthma    Colon polyp    Duodenitis    Eczema    Heart murmur    Dr Aliene Seip said it was nothing to worry about   History of kidney stones    HNP (herniated nucleus pulposus), cervical    Hypercholesteremia    Prediabetes     Past Surgical History:  Procedure Laterality Date   BALLOON DILATION N/A 11/22/2016   Procedure: BALLOON DILATION;  Surgeon: Gladis MARLA Louder, MD;  Location: [facility];  Service: Endoscopy;  Laterality: N/A;   CARDIAC CATHETERIZATION  11/23   CESAREAN SECTION     x 3   COLONOSCOPY WITH PROPOFOL  N/A 11/22/2016   Procedure: COLONOSCOPY WITH  PROPOFOL ;  Surgeon: Gladis MARLA Louder, MD;  Location: [facility];  Service: Endoscopy;  Laterality: N/A;   colonscopy     x 2   ESOPHAGOGASTRODUODENOSCOPY (EGD) WITH PROPOFOL  N/A 11/22/2016   Procedure: ESOPHAGOGASTRODUODENOSCOPY (EGD) WITH PROPOFOL ;  Surgeon: Gladis MARLA Louder, MD;  Location: [facility];  Service: Endoscopy;  Laterality: N/A;   LEFT HEART CATH AND CORONARY ANGIOGRAPHY N/A 08/01/2022   Procedure: LEFT HEART CATH AND CORONARY ANGIOGRAPHY;  Surgeon: Court Dorn PARAS, MD;  Location: [facility];  Service: Cardiovascular;  Laterality: N/A;   SAVORY DILATION N/A 11/22/2016   Procedure: SAVORY DILATION;  Surgeon: Gladis MARLA Louder, MD;  Location: [facility];  Service: Endoscopy;  Laterality: N/A;    FAMHx:  Family History  Problem Relation Age of Onset   Cancer Mother    Heart disease Mother    Breast cancer Mother 30   Hyperlipidemia Mother    Cancer Father    Early death Father    Colon cancer Paternal Aunt        48s   Breast cancer Paternal Aunt    Stroke Maternal Grandmother    Diabetes Maternal Grandmother    Heart attack Cousin    Colon polyps Neg Hx    Esophageal cancer Neg Hx    Rectal cancer Neg Hx    Stomach cancer Neg Hx     SOCHx:   reports that she has  never smoked. She has never used smokeless tobacco. She reports current alcohol use of about 4.0 standard drinks of alcohol per week. She reports that she does not use drugs.  ALLERGIES:  Allergies  Allergen Reactions   Evolocumab      rash  Other Reaction(s): swelling at site of injection   Celebrex [Celecoxib] Rash   Latex Rash    ROS: Pertinent items noted in HPI and remainder of comprehensive ROS otherwise negative.  HOME MEDS: Current Outpatient Medications on File Prior to Visit  Medication Sig Dispense Refill   Alirocumab  (PRALUENT ) 75 MG/ML SOAJ Inject 1 mL (75 mg total) into the skin every 14 (fourteen) days. 2 mL 11   ALPRAZolam  (XANAX ) 0.25 MG tablet Take 0.25 mg by  mouth daily as needed for anxiety.     aspirin  EC 81 MG tablet Take 81 mg by mouth daily. Swallow whole.     budesonide-formoterol (SYMBICORT) 160-4.5 MCG/ACT inhaler 2 puffs Inhalation Twice a day as needed     Calcium  250 MG CAPS      Carboxymethylcell-Hypromellose (GENTEAL OP) Place 1 drop into both eyes at bedtime.     cetirizine (ZYRTEC) 10 MG tablet Take 10 mg by mouth daily as needed for allergies.     cholecalciferol (VITAMIN D) 1000 units tablet Take 1,000 Units by mouth daily.     escitalopram (LEXAPRO) 10 MG tablet Take 15 mg by mouth daily. May increase to 10 mg after 7 days.     fluocinonide cream (LIDEX) 0.05 % as directed Externally not for face or skin folds once daily for 2 weeks     Naproxen Sodium (ALEVE) 220 MG CAPS Take 1 capsule by mouth daily as needed (pain). Rarely takes     nitroGLYCERIN  (NITROSTAT ) 0.4 MG SL tablet Place 0.4 mg under the tongue every 5 (five) minutes as needed for chest pain.     Rosuvastatin  Calcium  40 MG CPSP Take 40 mg by mouth daily.     semaglutide-weight management (WEGOVY) 2.4 MG/0.75ML SOAJ SQ injection as directed.     tretinoin (RETIN-A) 0.05 % cream APPLY EXTERNALLY TO THE AFFECTED AREA OF THE FACE IN THE EVENING ONCE DAILY for 20     diphenhydrAMINE (BENADRYL ALLERGY) 25 mg capsule Take 1 capsule as needed by oral route. (Patient not taking: Reported on 06/18/2024)     No current facility-administered medications on file prior to visit.    LABS/IMAGING: No results found for this or any previous visit (from the past 48 hours). No results found.  LIPID PANEL:    Component Value Date/Time   CHOL 104 05/11/2023 1103   TRIG 49 05/11/2023 1103   HDL 63 05/11/2023 1103   CHOLHDL 1.7 05/11/2023 1103   CHOLHDL 2.5 09/21/2022 0842   VLDL 10 09/21/2022 0842   LDLCALC 29 05/11/2023 1103    Lipoprotein (a)  Date/Time Value Ref Range Status  07/14/2022 11:10 AM 191.4 (H) <75.0 nmol/L Final    Comment:    (NOTE) **Results verified by  repeat testing** Note:  Values greater than or equal to 75.0 nmol/L may       indicate an independent risk factor for CHD,       but must be evaluated with caution when applied       to non-Caucasian populations due to the       influence of genetic factors on Lp(a) across       ethnicities. Performed At: [facility] [address] [place], [place] [id number] Nagendra Sanjai  MD Ey:[id number]      WEIGHTS: Wt Readings from Last 3 Encounters:  06/18/24 226 lb 4.8 oz (102.6 kg)  01/18/24 222 lb 8 oz (100.9 kg)  10/25/23 218 lb 11.2 oz (99.2 kg)    VITALS: BP 108/72 (BP Location: Right Arm, Patient Position: Sitting, Cuff Size: Large)   Pulse 72   Ht 5' 9 (1.753 m)   Wt 226 lb 4.8 oz (102.6 kg)   SpO2 95%   BMI 33.42 kg/m   EXAM: Deferred  EKG: Deferred  ASSESSMENT: Dyslipidemia, goal LDL less than 55 Elevated OE(j)-808 nmol/L Severe coronary artery calcification, CAC score of 1252, 99th percentile (2023) Heavy coronary calcification but mild nonobstructive coronary disease by cath in 2023  PLAN: 1.   Ms. Tullo has a dyslipidemia but is reached target LDL less than 55.  She does have an elevated LP(a) which was last assessed a number of years ago, I believe before starting on Praluent .  LP(a) may be actually lower and I suspect based on that she may not qualify for any upcoming clinical trials at [facility].  We can plan to further assess her lipids by repeating a lipid NMR, LP(a) and high-sensitivity CRP.  Overall I tried to reassure her and the fact that she does have extensive coronary artery calcification but no significant obstruction suggest a more stable coronary finding and now she has very good control of her lipids.  She may be candidate for LP(a) specific lowering therapies when they are commercially available in the next few years.  We also discussed genetic testing today.  This may help further understand the etiology of her aggressive coronary disease and  perhaps suggest further treatment options.  She understands this is a send out test and may take up to a few weeks to get the results back which time I will contact her with those findings.  Medicare may not cover this testing and she is aware it may be up to a $299 charge.  TIME SPENT WITH PATIENT: 45 minutes of direct patient care. More than 50% of that time was spent on coordination of care and counseling regarding cholesterol management, coronary artery calcification, cardiovascular disease risk, review of CT and cath findings, extensive discussion on LP(a) and upcoming clinical trials, extensive discussion on mechanisms of cardiovascular disease, benefits and drawbacks of genetic testing, additional cardiovascular risk markers  Vinie KYM Maxcy, MD, [facility], FNLA, FACP  [place]  [facility]  Medical Director of the Advanced Lipid Disorders &  Cardiovascular Risk Reduction Clinic Diplomate of the American Board of Clinical Lipidology Attending Cardiologist  Direct Dial: [phone]  Fax: [phone]  Website:  [email or web address]  Vinie BROCKS Kinley Ferrentino 06/18/2024, 1:56 PM

## 2024-06-18 NOTE — Telephone Encounter (Signed)
 Genetic test for E78.01 ordered ([facility]) Cheek swab completed in office Specimen and necessary paperwork mailed. ID: [id number]

## 2024-06-20 DIAGNOSIS — I2584 Coronary atherosclerosis due to calcified coronary lesion: Secondary | ICD-10-CM | POA: Diagnosis not present

## 2024-06-20 DIAGNOSIS — E7800 Pure hypercholesterolemia, unspecified: Secondary | ICD-10-CM | POA: Diagnosis not present

## 2024-06-20 DIAGNOSIS — Z6833 Body mass index (BMI) 33.0-33.9, adult: Secondary | ICD-10-CM | POA: Diagnosis not present

## 2024-06-20 DIAGNOSIS — E782 Mixed hyperlipidemia: Secondary | ICD-10-CM | POA: Diagnosis not present

## 2024-06-20 DIAGNOSIS — E785 Hyperlipidemia, unspecified: Secondary | ICD-10-CM | POA: Diagnosis not present

## 2024-06-20 DIAGNOSIS — R42 Dizziness and giddiness: Secondary | ICD-10-CM | POA: Diagnosis not present

## 2024-06-20 DIAGNOSIS — R030 Elevated blood-pressure reading, without diagnosis of hypertension: Secondary | ICD-10-CM | POA: Diagnosis not present

## 2024-06-20 DIAGNOSIS — R931 Abnormal findings on diagnostic imaging of heart and coronary circulation: Secondary | ICD-10-CM | POA: Diagnosis not present

## 2024-06-20 DIAGNOSIS — F419 Anxiety disorder, unspecified: Secondary | ICD-10-CM | POA: Diagnosis not present

## 2024-06-20 DIAGNOSIS — Z23 Encounter for immunization: Secondary | ICD-10-CM | POA: Diagnosis not present

## 2024-06-20 DIAGNOSIS — I2510 Atherosclerotic heart disease of native coronary artery without angina pectoris: Secondary | ICD-10-CM | POA: Diagnosis not present

## 2024-06-21 LAB — NMR, LIPOPROFILE
Cholesterol, Total: 127 mg/dL (ref 100–199)
HDL Particle Number: 39.5 umol/L (ref >=30.5)
HDL-C: 62 mg/dL (ref >39)
LDL Particle Number: 430 nmol/L (ref <1000)
LDL Size: 21.1 nm (ref >20.5)
LDL-C (NIH Calc): 51 mg/dL (ref 0–99)
LP-IR Score: 33 (ref <=45)
Small LDL Particle Number: 190 nmol/L (ref <=527)
Triglycerides: 67 mg/dL (ref 0–149)

## 2024-06-21 LAB — HIGH SENSITIVITY CRP: CRP, High Sensitivity: 0.41 mg/L (ref 0.00–3.00)

## 2024-06-22 LAB — LIPOPROTEIN A (LPA): Lipoprotein (a): 236 nmol/L — ABNORMAL HIGH (ref <75.0)

## 2024-06-23 ENCOUNTER — Ambulatory Visit: Payer: Self-pay | Admitting: Internal Medicine

## 2024-06-26 DIAGNOSIS — F4322 Adjustment disorder with anxiety: Secondary | ICD-10-CM | POA: Diagnosis not present

## 2024-06-28 DIAGNOSIS — I2510 Atherosclerotic heart disease of native coronary artery without angina pectoris: Secondary | ICD-10-CM | POA: Diagnosis not present

## 2024-07-04 ENCOUNTER — Encounter (HOSPITAL_BASED_OUTPATIENT_CLINIC_OR_DEPARTMENT_OTHER): Payer: Self-pay | Admitting: Internal Medicine

## 2024-07-08 DIAGNOSIS — R635 Abnormal weight gain: Secondary | ICD-10-CM | POA: Diagnosis not present

## 2024-07-08 DIAGNOSIS — E669 Obesity, unspecified: Secondary | ICD-10-CM | POA: Diagnosis not present

## 2024-07-08 DIAGNOSIS — F4322 Adjustment disorder with anxiety: Secondary | ICD-10-CM | POA: Diagnosis not present

## 2024-07-08 DIAGNOSIS — F419 Anxiety disorder, unspecified: Secondary | ICD-10-CM | POA: Diagnosis not present

## 2024-07-08 DIAGNOSIS — Z6833 Body mass index (BMI) 33.0-33.9, adult: Secondary | ICD-10-CM | POA: Diagnosis not present

## 2024-07-08 DIAGNOSIS — R7303 Prediabetes: Secondary | ICD-10-CM | POA: Diagnosis not present

## 2024-07-08 DIAGNOSIS — I2510 Atherosclerotic heart disease of native coronary artery without angina pectoris: Secondary | ICD-10-CM | POA: Diagnosis not present

## 2024-07-08 DIAGNOSIS — E785 Hyperlipidemia, unspecified: Secondary | ICD-10-CM | POA: Diagnosis not present

## 2024-07-15 DIAGNOSIS — L821 Other seborrheic keratosis: Secondary | ICD-10-CM | POA: Diagnosis not present

## 2024-07-15 DIAGNOSIS — H43813 Vitreous degeneration, bilateral: Secondary | ICD-10-CM | POA: Diagnosis not present

## 2024-07-15 DIAGNOSIS — H2513 Age-related nuclear cataract, bilateral: Secondary | ICD-10-CM | POA: Diagnosis not present

## 2024-07-15 DIAGNOSIS — L720 Epidermal cyst: Secondary | ICD-10-CM | POA: Diagnosis not present

## 2024-07-15 DIAGNOSIS — H35421 Microcystoid degeneration of retina, right eye: Secondary | ICD-10-CM | POA: Diagnosis not present

## 2024-07-19 DIAGNOSIS — E669 Obesity, unspecified: Secondary | ICD-10-CM | POA: Diagnosis not present

## 2024-07-19 DIAGNOSIS — I2510 Atherosclerotic heart disease of native coronary artery without angina pectoris: Secondary | ICD-10-CM | POA: Diagnosis not present

## 2024-07-19 DIAGNOSIS — J45909 Unspecified asthma, uncomplicated: Secondary | ICD-10-CM | POA: Diagnosis not present

## 2024-07-19 DIAGNOSIS — E663 Overweight: Secondary | ICD-10-CM | POA: Diagnosis not present

## 2024-07-25 DIAGNOSIS — F4322 Adjustment disorder with anxiety: Secondary | ICD-10-CM | POA: Diagnosis not present

## 2024-07-28 DIAGNOSIS — I2510 Atherosclerotic heart disease of native coronary artery without angina pectoris: Secondary | ICD-10-CM | POA: Diagnosis not present

## 2024-07-29 ENCOUNTER — Encounter (HOSPITAL_BASED_OUTPATIENT_CLINIC_OR_DEPARTMENT_OTHER): Payer: Self-pay | Admitting: Internal Medicine

## 2024-08-05 ENCOUNTER — Encounter: Payer: Self-pay | Admitting: *Deleted

## 2024-08-05 ENCOUNTER — Other Ambulatory Visit: Payer: Self-pay | Admitting: *Deleted

## 2024-08-05 DIAGNOSIS — Z006 Encounter for examination for normal comparison and control in clinical research program: Secondary | ICD-10-CM

## 2024-08-05 NOTE — Research (Signed)
 Message left for Abigail Byrd to inform her about the Pre-event research study. Encouraged her to call if interested in receiving more info about the study.

## 2024-08-05 NOTE — Research (Signed)
 Abigail Byrd called and states she is interested in coming in.scheduled for  Dec10 at 1300.

## 2024-08-05 NOTE — Progress Notes (Signed)
 Ms Menzel called to request more information about pre-event research study. Emailed her the consents to read over. Encouraged her to call or email with any questions.

## 2024-08-21 ENCOUNTER — Ambulatory Visit: Admitting: Emergency Medicine

## 2024-08-21 ENCOUNTER — Encounter: Payer: Self-pay | Admitting: Emergency Medicine

## 2024-08-21 VITALS — BP 127/75 | HR 72 | Ht 69.0 in | Wt 228.0 lb

## 2024-08-21 DIAGNOSIS — F419 Anxiety disorder, unspecified: Secondary | ICD-10-CM | POA: Diagnosis not present

## 2024-08-21 NOTE — Progress Notes (Unsigned)
 [facility] MD/PA/NP Initial Note  08/21/2024 4:42 PM Abigail Byrd  MRN:  [id number]  Chief Complaint:  Chief Complaint   Establish Care; Anxiety     HPI:   Mrs. Abigail Byrd is a 68 yo F presenting to clinic for new patient psychiatric evaluation, with concerns of nxi et   Current Psych Medication Regimen: Lexapro 15 mg po daily Started Nov 2023 after stopping  Taking for 2 years Xanax  .25 mg daily PRN for anxiety Started taking summer of 2023 after finding diagnsosi  [place] 2 weeks   Health related anxiety regarding calcium  buildup around heart Got dx at tail end of covid. Lost 40lbs due to anxiety and eating healthy due to worry of heart condition. Then was placed on lexapro and gained back 30 lbs. Followed up with cardiology and was told that she might have heart attack in the 2 - 3 years. All of this has been anxiety provoking Time bomb in her heart  Anxiety symptoms are controlled with decreased worry and restlessness; no panic attacks reported. Sleep is adequate and restful. Appetite is stable, with assistance with Wegovy. Intact ADLs and personal hygiene. Ongoing symptom monitoring continues.    Patient reports mood improvements with decreased feelings of hopelessness and tearfulness. denies significant depressive symptoms such as extreme sadness, tearfulness, or hopelessness.  Energy and motivation are stable; denies anhedonia. Cognitive functions like concentration, decision-making, and memory are intact. Denies psychomotor agitation or retardation. No excessive guilt or feelings of worthlessness. No somatic complaints reported.    Engaged in therapy [facility].   Denies mania, delirium, AVH, SI, HI, or self-harm behaviors. No further complaints at this time.   Visit Diagnosis:    ICD-10-CM   1. Anxiety  F41.9      Past Psychiatric History:  No prior hospitalization  Past Psych Medication Trials: None  Past Medical History:  Past Medical  History:  Diagnosis Date  . Acid reflux   . Allergy   . Anxiety   . Asthma   . Colon polyp   . Duodenitis   . Eczema   . Heart murmur    Dr Aliene Seip said it was nothing to worry about  . History of kidney stones   . HNP (herniated nucleus pulposus), cervical   . Hypercholesteremia   . Prediabetes     Past Surgical History:  Procedure Laterality Date  . BALLOON DILATION N/A 11/22/2016   Procedure: BALLOON DILATION;  Surgeon: Gladis MARLA Louder, MD;  Location: [facility];  Service: Endoscopy;  Laterality: N/A;  . CARDIAC CATHETERIZATION  11/23  . CESAREAN SECTION     x 3  . COLONOSCOPY WITH PROPOFOL  N/A 11/22/2016   Procedure: COLONOSCOPY WITH PROPOFOL ;  Surgeon: Gladis MARLA Louder, MD;  Location: [facility];  Service: Endoscopy;  Laterality: N/A;  . colonscopy     x 2  . ESOPHAGOGASTRODUODENOSCOPY (EGD) WITH PROPOFOL  N/A 11/22/2016   Procedure: ESOPHAGOGASTRODUODENOSCOPY (EGD) WITH PROPOFOL ;  Surgeon: Gladis MARLA Louder, MD;  Location: [facility];  Service: Endoscopy;  Laterality: N/A;  . LEFT HEART CATH AND CORONARY ANGIOGRAPHY N/A 08/01/2022   Procedure: LEFT HEART CATH AND CORONARY ANGIOGRAPHY;  Surgeon: Court Dorn PARAS, MD;  Location: [facility];  Service: Cardiovascular;  Laterality: N/A;  . SAVORY DILATION N/A 11/22/2016   Procedure: SAVORY DILATION;  Surgeon: Gladis MARLA Louder, MD;  Location: [facility];  Service: Endoscopy;  Laterality: N/A;    Family Psychiatric History:   M - anxiety and  depression F - n/a Daughter - depression  No FH of suicide, bipolar or schizophrenia   Family History:  Family History  Problem Relation Age of Onset  . Cancer Mother   . Heart disease Mother   . Breast cancer Mother 41  . Hyperlipidemia Mother   . Cancer Father   . Early death Father   . Colon cancer Paternal Aunt        59s  . Breast cancer Paternal Aunt   . Stroke Maternal Grandmother   . Diabetes Maternal Grandmother   . Heart attack Cousin   .  Colon polyps Neg Hx   . Esophageal cancer Neg Hx   . Rectal cancer Neg Hx   . Stomach cancer Neg Hx    Social History: Patient lives with husband. Retired  or currently [unemployed/retired/student]. Reports supportive  social support system. Denies tobacco, alcohol, or substance use. No history of legal issues or domestic violence reported. Financial and housing stability are stable.  Social History   Socioeconomic History  . Marital status: Married    Spouse name: Not on file  . Number of children: 3  . Years of education: B.S.  . Highest education level: Not on file  Occupational History  . Occupation: housewife  Tobacco Use  . Smoking status: Never  . Smokeless tobacco: Never  Vaping Use  . Vaping status: Never Used  Substance and Sexual Activity  . Alcohol use: Yes    Alcohol/week: 4.0 standard drinks of alcohol    Types: 4 Glasses of wine per week    Comment: Lowered my alchol intake when I started taking metformin  . Drug use: Never  . Sexual activity: Yes    Birth control/protection: Post-menopausal  Other Topics Concern  . Not on file  Social History Narrative   Patient does not drink caffeine.   Patient is right handed.   Social Drivers of Corporate Investment Banker Strain: Low Risk  (08/21/2024)   Overall Financial Resource Strain (CARDIA)   . Difficulty of Paying Living Expenses: Not hard at all  Food Insecurity: No Food Insecurity (07/31/2022)   Hunger Vital Sign   . Worried About Programme Researcher, Broadcasting/film/video in the Last Year: Never true   . Ran Out of Food in the Last Year: Never true  Transportation Needs: No Transportation Needs (07/31/2022)   PRAPARE - Transportation   . Lack of Transportation (Medical): No   . Lack of Transportation (Non-Medical): No  Physical Activity: Not on file  Stress: Stress Concern Present (08/21/2024)   [employer] of Occupational Health - Occupational Stress Questionnaire   . Feeling of Stress: To some extent  Social  Connections: Not on file    Allergies:  Allergies  Allergen Reactions  . Evolocumab      rash  Other Reaction(s): swelling at site of injection  . Celebrex [Celecoxib] Rash  . Latex Rash    Metabolic Disorder Labs: Lab Results  Component Value Date   HGBA1C (H) 04/23/2010    6.0 (NOTE)                                                                       According to the ADA Clinical Practice Recommendations for 2011, when HbA1c  is used as a screening test:   >=6.5%   Diagnostic of Diabetes Mellitus           (if abnormal result  is confirmed)  5.7-6.4%   Increased risk of developing Diabetes Mellitus  References:Diagnosis and Classification of Diabetes Mellitus,Diabetes Care,2011,34(Suppl 1):S62-S69 and Standards of Medical Care in         Diabetes - 2011,Diabetes Care,2011,34  (Suppl 1):S11-S61.   MPG 126 (H) 04/23/2010   No results found for: PROLACTIN Lab Results  Component Value Date   CHOL 104 05/11/2023   TRIG 49 05/11/2023   HDL 63 05/11/2023   CHOLHDL 1.7 05/11/2023   VLDL 10 09/21/2022   LDLCALC 29 05/11/2023   LDLCALC 25 10/28/2022   Lab Results  Component Value Date   TSH 1.052 07/14/2022   TSH 3.419 04/23/2010    Therapeutic Level Labs: No results found for: LITHIUM No results found for: VALPROATE No results found for: CBMZ  Current Medications: Current Outpatient Medications  Medication Sig Dispense Refill  . Alirocumab  (PRALUENT ) 75 MG/ML SOAJ Inject 1 mL (75 mg total) into the skin every 14 (fourteen) days. 2 mL 11  . ALPRAZolam  (XANAX ) 0.25 MG tablet Take 0.25 mg by mouth daily as needed for anxiety.    . aspirin  EC 81 MG tablet Take 81 mg by mouth daily. Swallow whole.    . budesonide-formoterol (SYMBICORT) 160-4.5 MCG/ACT inhaler 2 puffs Inhalation Twice a day as needed    . Calcium  250 MG CAPS     . Carboxymethylcell-Hypromellose (GENTEAL OP) Place 1 drop into both eyes at bedtime.    . cetirizine (ZYRTEC) 10 MG tablet Take 10 mg by  mouth daily as needed for allergies.    . cholecalciferol (VITAMIN D) 1000 units tablet Take 1,000 Units by mouth daily.    SABRA escitalopram (LEXAPRO) 10 MG tablet Take 15 mg by mouth daily. May increase to 10 mg after 7 days.    . fluocinonide cream (LIDEX) 0.05 % as directed Externally not for face or skin folds once daily for 2 weeks    . Naproxen Sodium (ALEVE) 220 MG CAPS Take 1 capsule by mouth daily as needed (pain). Rarely takes    . nitroGLYCERIN  (NITROSTAT ) 0.4 MG SL tablet Place 0.4 mg under the tongue every 5 (five) minutes as needed for chest pain.    . Rosuvastatin  Calcium  40 MG CPSP Take 40 mg by mouth daily.    . semaglutide-weight management (WEGOVY) 2.4 MG/0.75ML SOAJ SQ injection as directed.    . tretinoin (RETIN-A) 0.05 % cream APPLY EXTERNALLY TO THE AFFECTED AREA OF THE FACE IN THE EVENING ONCE DAILY for 20     No current facility-administered medications for this visit.    Medication Side Effects: none  Orders placed this visit:  No orders of the defined types were placed in this encounter.   Psychiatric Specialty Exam:  Review of Systems  Neurological:  Positive for tremors.  Psychiatric/Behavioral:         Please refer to HPI.  All other systems reviewed and are negative.   There were no vitals taken for this visit.There is no height or weight on file to calculate BMI.  General Appearance: Neat and Well Groomed  Eye Contact:  Good  Speech:  Normal Rate  Volume:  Normal  Mood:  Euthymic  Affect:  Appropriate  Thought Process:  Coherent, Goal Directed, and Linear  Orientation:  Full (Time, Place, and Person)  Thought Content: WDL   Suicidal  Thoughts:  No  Homicidal Thoughts:  No  Memory:  WNL  Judgement:  Good  Insight:  Good  Psychomotor Activity:  Normal  Concentration:  Concentration: Good  Recall:  Good  Fund of Knowledge: Good  Language: Good  Assets:  Communication Skills Desire for Improvement Financial  Resources/Insurance Transportation Vocational/Educational  ADL's:  Intact  Cognition: WNL  Prognosis:  Good   Screenings:  Flowsheet Row ED to Hosp-Admission (Discharged) from 07/30/2022 in [facility]  C-SSRS RISK CATEGORY No Risk    Receiving Psychotherapy: Yes  Engaged in individual psychotherapy sessions with Counselor Emmie occurring bi weekly as part of the overall treatment plan.  Treatment Plan/Recommendations:   I provided approximately 60 minutes of face to face time during this encounter, including time spent before and after the visit in records review, medical decision making, counseling pertinent to today's visit, and charting.   Discussed dx and tx plan. Discussed alternative options including therapy.  PDMP reviewed: low risk trend    Anxiety - not controlled Taper Lexapro from 15 mg to 10 mg po daily upon pt request Advised to keep a medication side effect journal to systematically track any symptoms, noting onset, frequency, severity, and context of side effects. Psychotherapy was strongly recommended as important adjunct treatment to medication - provided list of local counselors. Discussed therapeutic lifestyle modifications emphasizing the importance of regular exercise of at least 3x a week for 30 min, incorporating healthier diet options with a focus on balanced nutrition and reduced processed foods, and considering appropriate supplementation including multi vitamin, vitamin d, b 12, magnesium glycinate, iron to support overall well-being.  FOLLOW UP: 4 weeks or sooner if clinically indicated.  Risks, benefits, and alternatives of the medications and treatment plan prescribed today were discussed. Instructed patient to contact office or go to ED if experiencing any significant tolerability issues. Patient engaged in shared decision-making;treatment plan reviewed and agreed upon.    Jorie Zee, PA-C

## 2024-08-28 ENCOUNTER — Encounter: Admitting: *Deleted

## 2024-08-28 DIAGNOSIS — Z006 Encounter for examination for normal comparison and control in clinical research program: Secondary | ICD-10-CM

## 2024-08-28 DIAGNOSIS — R7303 Prediabetes: Secondary | ICD-10-CM | POA: Diagnosis not present

## 2024-08-28 NOTE — Research (Signed)
 Subject Name: Abigail Byrd  Subject met inclusion and exclusion criteria.  The informed consent form, study requirements and expectations were reviewed with the subject and questions and concerns were addressed prior to the signing of the consent form.  The subject verbalized understanding of the trial requirements.  The subject agreed to participate in the lpa screening part of pre-event   trial and signed the informed consent at 1340 on 08-28-2024.  The informed consent was obtained prior to performance of any protocol-specific procedures for the subject.  A copy of the signed informed consent was given to the subject and a copy was placed in the subject's medical record.   Abigail Byrd  Protocol number 1 Amendment 2  Consent version  2Core Consent    Pre-Event Lp(a) Screening Visit  Date of Visit: 28-Aug-2024              Subject Number: [id number]  During this visit the following activities were completed:  [x] Reading, Signing and Understanding the informed Consent for Lp(a) screening  [x] Demographics  [x] Medical History  [x] Adverse events  [x] Serious adverse device effects  [x] Concomitant therapies reviewed  [x] Central labs      [x] Lp(a)  Pre-event  INCLUSION  Yes No  Participant has provided written informed consent before initiation of any study-specific activities/procedures. [x]  []   Age >= 50 years at the time of signing of the Lp(a) screening ICF. [x]  []   Lp(a) >= 200 nmol/L during Lp(a) screening by a central laboratory using an investigational IVD test. At least 4 weeks of stable and optimized lipid-lowering therapy consistent with regional/local clinical practice guidelines or according to investigators judgment before Lp(a) screening.                                                                                                         _______________ Waiting on results   Participants meeting at least one of the following categories (A or  B):  A. Multiple risk factors for atherosclerotic disease  1. Presence of >= 4 of any of the following ASCVD risk factors: a. Age >= 65 years men or women b. History of hypertension requiring pharmacotherapy c. Current cigarette tobacco smoking d. Diabetes mellitus (Type 1 or Type 2) requiring pharmacotherapy e. Screening high-sensitivity C-reactive protein (hs-CRP) >= 2.0 mg/L by central laboratory f. Screening estimated glomerular filtration rate (eGFR) 30 to < 60 mL/min/1.73 m2 by central laboratory g. Familial hypercholesterolemia  or family history of premature ASCVD (1st degree relative before age 63 years for males, and/or 1st degree relative before age 43 years for females)  AND/OR  B. History of atherosclerosis evidenced by:  1. Coronary Artery Disease-Reporting and Data System (CAD-RADS) P3, and/or 2. Coronary artery calcium  (CAC) > 300, and/or                          CAC 1252 3. Atherosclerosis defined by at least one of the following AND >= 2 additional risk factors listed in 104 A: a. Coronary atherosclerosis as evidenced by a  stenosis >= 50% in at least one artery b. Coronary atherosclerosis as evidenced by a stenosis >= 25% (or reported as at least mild) in at least 2 arteries detected on invasive or noninvasive imaging c. Coronary atherosclerosis as evidenced by a CAC score > 100 and/or CAD-RADS >= P2 d. Carotid atherosclerosis as evidenced by an internal carotid stenosis >= 50% e. Peripheral atherosclerosis as evidenced by >= 50% stenosis of limb artery and/or ankle brachial index < 0.9                                                                     Bold the above patient meets.  [x]  []   EXCLUSION Yes No  Prior acute atherothrombotic qualifying event at any time, defined as prior myocardial infarction, prior stroke, prior transient ischemic attack, or prior acute limb ischemia. []  [x]   Prior arterial revascularization at any time suspected to be  associated with atherosclerosis. []  [x]   If available, participants without known atherosclerosis, CAC score = 0, CAD-RADS = 0, in the last 10 years prior to enrollment. []  [x]   Severe renal dysfunction, defined as an eGFR < 30 mL/min/1.73 m2 by central laboratory during screening []  [x]   History of decompensated liver cirrhosis and/or screening aspartate aminotransferase (AST) or alanine aminotransferase (ALT) > 3 x upper limit of normal (ULN), or total bilirubin (TBL) > 2 x ULN (except in stable Gilberts syndrome). []  [x]   History of major bleeding disorder (eg, hemophilia, von Willebrand disease, clotting factor deficiencies, etc). []  [x]   Planned arterial revascularization (percutaneous or surgical). []  [x]   Fasting triglycerides > 400 mg/dL (5.47 mmol/L) during screening. []  [x]   Participant has known sensitivity to any of the products or components to be administered during dosing or the study procedures. []  [x]   Malignancy not in remission for at least 5 years prior to enrollment, exceptions for less than 5 years since remission include non-melanoma skin cancers, localized thyroid  cancer (papillary, follicular, and medullary), cervical in situ carcinoma, breast ductal carcinoma in situ, or stage 1 prostate carcinoma. []  [x]   Diagnosis of severe heart failure (New York  Heart Association Functional Classification IV), and/or if available, most recent left ventricular ejection fraction < 30%. []  [x]   Uncontrolled or recurrent ventricular tachycardia in the past 3 months before enrollment. []  [x]   Atrial fibrillation or flutter and not on an anticoagulant despite an indication to be anticoagulated.    Uncontrolled hypertension at screening, defined as systolic blood pressure > 180 mmHg or diastolic blood pressure > 110 mmHg at rest despite antihypertensive therapy. []  [x]   Diabetes mellitus (Type 1 or Type 2) with a hemoglobin A1C (HbA1c) >= 10% by central laboratory at screening. []  [x]    History or evidence of any other clinically significant disorder, condition, or disease (such as active infection) that, in the opinion of the investigator or [employer] physician, if consulted, would pose a risk to participant safety, or interfere with the study evaluation, procedures or completion. []  [x]   Current or planned lipoprotein apheresis or < 3 months since last apheresis treatment before enrollment. []  [x]   Participant has taken a cholesteryl ester transfer protein inhibitor or lomitapide in the last 12 months before enrollment. []  [x]   Previously received any therapy specifically targeting Lp(a) including but not limited  to, olpasiran, pelacarsen, lepodisiran, zerlasiran, or muvalaplin []  [x]    Currently receiving treatment in another investigational device or drug study, or less than 30 days since ending treatment on another investigational device or drug study(ies). Other investigational procedures while participating in this study are excluded. []  [x]   Participants of childbearing potential unwilling to use protocol-specified method of contraception during treatment and for an additional 30 days after the last dose of investigational product.  []  [x]   Participants who are breastfeeding or who plan to breastfeed while on study through 30 days after the last dose of investigational product []  [x]   Participants planning to become pregnant while on study through 30 days after the last dose of investigational product.  []  [x]   Participants of childbearing potential with a positive pregnancy test assessed at screening and/or day 1 by a highly sensitive urine or serum pregnancy test. []  [x]   Participant likely to not be available to complete all protocol-required study visits or procedures, and/or to comply with all required study procedures (eg, Clinical Outcome Assessments) to the best of the Participant and investigators knowledge.                         []  [x]    Ms.Watford is here  for LPa screening for pre-event research study. She reports no visits to the Ed or urgent care. Blood drawn at 1354. Informed her I will call after receiving the lab results.  Voices understanding. She was placed in a quiet room to read the consent. She was given time to ask questions.    Current Outpatient Medications:    Alirocumab  (PRALUENT ) 75 MG/ML SOAJ, Inject 1 mL (75 mg total) into the skin every 14 (fourteen) days., Disp: 2 mL, Rfl: 11   ALPRAZolam  (XANAX ) 0.25 MG tablet, Take 0.25 mg by mouth daily as needed for anxiety., Disp: , Rfl:    aspirin  EC 81 MG tablet, Take 81 mg by mouth daily. Swallow whole., Disp: , Rfl:    budesonide-formoterol (SYMBICORT) 160-4.5 MCG/ACT inhaler, 2 puffs Inhalation Twice a day as needed, Disp: , Rfl:    Calcium  250 MG CAPS, , Disp: , Rfl:    Carboxymethylcell-Hypromellose (GENTEAL OP), Place 1 drop into both eyes at bedtime., Disp: , Rfl:    cetirizine (ZYRTEC) 10 MG tablet, Take 10 mg by mouth daily as needed for allergies., Disp: , Rfl:    cholecalciferol (VITAMIN D) 1000 units tablet, Take 1,000 Units by mouth daily., Disp: , Rfl:    escitalopram (LEXAPRO) 10 MG tablet, Take 15 mg by mouth daily. May increase to 10 mg after 7 days., Disp: , Rfl:    fluocinonide cream (LIDEX) 0.05 %, as directed Externally not for face or skin folds once daily for 2 weeks, Disp: , Rfl:    Naproxen Sodium (ALEVE) 220 MG CAPS, Take 1 capsule by mouth daily as needed (pain). Rarely takes, Disp: , Rfl:    nitroGLYCERIN  (NITROSTAT ) 0.4 MG SL tablet, Place 0.4 mg under the tongue every 5 (five) minutes as needed for chest pain., Disp: , Rfl:    Rosuvastatin  Calcium  40 MG CPSP, Take 40 mg by mouth daily., Disp: , Rfl:    semaglutide-weight management (WEGOVY) 2.4 MG/0.75ML SOAJ SQ injection, as directed., Disp: , Rfl:    tretinoin (RETIN-A) 0.05 % cream, APPLY EXTERNALLY TO THE AFFECTED AREA OF THE FACE IN THE EVENING ONCE DAILY for 20, Disp: , Rfl:

## 2024-09-04 ENCOUNTER — Encounter: Payer: Self-pay | Admitting: *Deleted

## 2024-09-04 DIAGNOSIS — Z006 Encounter for examination for normal comparison and control in clinical research program: Secondary | ICD-10-CM

## 2024-09-04 NOTE — Research (Addendum)
 Tonya Wantz Lpa screening 28-Aug-2024     Looks like a screen fail LPa below 200.  Lipids:  Lpa 170.0  nmol/L                        [] Clinically Significant  [] Not Clinically Significant   Any further action needed to be taken per the PI?  No  Vinie KYM Maxcy, MD, [facility], FNLA, FACP  [place]  [facility]  Medical Director of the Advanced Lipid Disorders &  Cardiovascular Risk Reduction Clinic Diplomate of the American Board of Clinical Lipidology Attending Cardiologist  Direct Dial: [phone]  Fax: [phone]  Website:  [email or web address]

## 2024-09-04 NOTE — Research (Signed)
 Spoke with Abigail Byrd about Lpa results. Lpa was 170 so she is a screenfail for pre-event.

## 2024-09-16 ENCOUNTER — Ambulatory Visit: Admitting: Emergency Medicine

## 2024-09-16 ENCOUNTER — Encounter: Payer: Self-pay | Admitting: *Deleted

## 2024-09-16 DIAGNOSIS — Z006 Encounter for examination for normal comparison and control in clinical research program: Secondary | ICD-10-CM

## 2024-09-16 NOTE — Research (Signed)
 Message left for Abigail Byrd to inform her to pick up her Clin card.

## 2024-09-24 ENCOUNTER — Ambulatory Visit: Admitting: Emergency Medicine

## 2024-10-09 ENCOUNTER — Other Ambulatory Visit: Payer: Self-pay | Admitting: Family Medicine

## 2024-10-09 DIAGNOSIS — Z1231 Encounter for screening mammogram for malignant neoplasm of breast: Secondary | ICD-10-CM

## 2024-11-07 ENCOUNTER — Ambulatory Visit
